# Patient Record
Sex: Female | Born: 1990 | Race: White | Hispanic: No | Marital: Married | Smoking: Never smoker
Health system: Southern US, Community
[De-identification: ages and names within clinical notes are randomized; demographics above are authoritative.]

## PROBLEM LIST (undated history)

## (undated) DIAGNOSIS — G479 Sleep disorder, unspecified: Secondary | ICD-10-CM

## (undated) DIAGNOSIS — T8859XA Other complications of anesthesia, initial encounter: Secondary | ICD-10-CM

## (undated) DIAGNOSIS — F329 Major depressive disorder, single episode, unspecified: Secondary | ICD-10-CM

## (undated) DIAGNOSIS — E0500 Thyrotoxicosis with diffuse goiter without thyrotoxic crisis or storm: Secondary | ICD-10-CM

## (undated) DIAGNOSIS — K648 Other hemorrhoids: Secondary | ICD-10-CM

## (undated) DIAGNOSIS — T7840XA Allergy, unspecified, initial encounter: Secondary | ICD-10-CM

## (undated) DIAGNOSIS — Z8619 Personal history of other infectious and parasitic diseases: Secondary | ICD-10-CM

## (undated) DIAGNOSIS — Z87898 Personal history of other specified conditions: Secondary | ICD-10-CM

## (undated) DIAGNOSIS — R350 Frequency of micturition: Secondary | ICD-10-CM

## (undated) DIAGNOSIS — T4145XA Adverse effect of unspecified anesthetic, initial encounter: Secondary | ICD-10-CM

## (undated) DIAGNOSIS — N809 Endometriosis, unspecified: Secondary | ICD-10-CM

## (undated) DIAGNOSIS — F3181 Bipolar II disorder: Secondary | ICD-10-CM

## (undated) DIAGNOSIS — Z915 Personal history of self-harm: Secondary | ICD-10-CM

## (undated) DIAGNOSIS — F419 Anxiety disorder, unspecified: Secondary | ICD-10-CM

## (undated) DIAGNOSIS — T7421XA Adult sexual abuse, confirmed, initial encounter: Secondary | ICD-10-CM

## (undated) DIAGNOSIS — K219 Gastro-esophageal reflux disease without esophagitis: Secondary | ICD-10-CM

## (undated) DIAGNOSIS — R3915 Urgency of urination: Secondary | ICD-10-CM

## (undated) DIAGNOSIS — R351 Nocturia: Secondary | ICD-10-CM

## (undated) DIAGNOSIS — Z5189 Encounter for other specified aftercare: Secondary | ICD-10-CM

## (undated) DIAGNOSIS — L739 Follicular disorder, unspecified: Secondary | ICD-10-CM

## (undated) DIAGNOSIS — M797 Fibromyalgia: Secondary | ICD-10-CM

## (undated) DIAGNOSIS — R1031 Right lower quadrant pain: Secondary | ICD-10-CM

## (undated) DIAGNOSIS — F32A Depression, unspecified: Secondary | ICD-10-CM

## (undated) DIAGNOSIS — E039 Hypothyroidism, unspecified: Secondary | ICD-10-CM

## (undated) DIAGNOSIS — K589 Irritable bowel syndrome without diarrhea: Secondary | ICD-10-CM

## (undated) HISTORY — DX: Other hemorrhoids: K64.8

## (undated) HISTORY — DX: Fibromyalgia: M79.7

## (undated) HISTORY — DX: Personal history of other infectious and parasitic diseases: Z86.19

## (undated) HISTORY — DX: Irritable bowel syndrome, unspecified: K58.9

## (undated) HISTORY — DX: Hypothyroidism, unspecified: E03.9

## (undated) HISTORY — DX: Encounter for other specified aftercare: Z51.89

## (undated) HISTORY — DX: Personal history of other specified conditions: Z87.898

## (undated) HISTORY — DX: Gastro-esophageal reflux disease without esophagitis: K21.9

## (undated) HISTORY — DX: Endometriosis, unspecified: N80.9

## (undated) HISTORY — DX: Thyrotoxicosis with diffuse goiter without thyrotoxic crisis or storm: E05.00

## (undated) HISTORY — DX: Allergy, unspecified, initial encounter: T78.40XA

## (undated) HISTORY — DX: Adult sexual abuse, confirmed, initial encounter: T74.21XA

## (undated) HISTORY — DX: Follicular disorder, unspecified: L73.9

## (undated) HISTORY — DX: Bipolar II disorder: F31.81

---
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
## 2008-06-08 ENCOUNTER — Encounter: Admission: RE | Admit: 2008-06-08 | Discharge: 2008-06-08 | Payer: Self-pay | Admitting: Gastroenterology

## 2008-08-02 ENCOUNTER — Other Ambulatory Visit: Admission: RE | Admit: 2008-08-02 | Discharge: 2008-08-02 | Payer: Self-pay | Admitting: Obstetrics and Gynecology

## 2010-08-13 ENCOUNTER — Ambulatory Visit (HOSPITAL_BASED_OUTPATIENT_CLINIC_OR_DEPARTMENT_OTHER): Admission: RE | Admit: 2010-08-13 | Discharge: 2010-08-14 | Payer: Self-pay | Admitting: Otolaryngology

## 2010-08-13 HISTORY — PX: TONSILLECTOMY AND ADENOIDECTOMY: SUR1326

## 2010-10-28 ENCOUNTER — Encounter (HOSPITAL_COMMUNITY): Payer: Self-pay | Admitting: Obstetrics and Gynecology

## 2010-11-29 ENCOUNTER — Other Ambulatory Visit: Payer: Self-pay | Admitting: Gastroenterology

## 2010-11-30 ENCOUNTER — Ambulatory Visit
Admission: RE | Admit: 2010-11-30 | Discharge: 2010-11-30 | Disposition: A | Discharge status: Home / Self Care | Payer: BC Managed Care – PPO | Source: Ambulatory Visit | Attending: Gastroenterology | Admitting: Gastroenterology

## 2010-11-30 MED ORDER — IOHEXOL 300 MG/ML  SOLN
100.0000 mL | Freq: Once | INTRAMUSCULAR | Status: AC | PRN
  Administered 2010-11-30: 100 mL via INTRAVENOUS
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
## 2011-07-19 LAB — TSH: TSH: 0.007 — ABNORMAL LOW

## 2011-07-19 LAB — SALICYLATE LEVEL: Salicylate Lvl: <4

## 2011-07-19 LAB — I-STAT 8, (EC8 V) (CONVERTED LAB)
HCT: 45
Potassium: 3.6
TCO2: 29
pCO2, Ven: 52.5 — ABNORMAL HIGH
pH, Ven: 7.318 — ABNORMAL HIGH

## 2011-07-19 LAB — CBC
HCT: 43.5
Hemoglobin: 14.7
MCV: 82.4
WBC: 19.2 — ABNORMAL HIGH

## 2011-07-19 LAB — URINE MICROSCOPIC-ADD ON

## 2011-07-19 LAB — T3, FREE: T3, Free: 4.7 — ABNORMAL HIGH (ref 2.3–4.2)

## 2011-07-19 LAB — DIFFERENTIAL
Eosinophils Relative: 0
Lymphocytes Relative: 8 — ABNORMAL LOW
Lymphs Abs: 1.5
Monocytes Absolute: 0.7
Monocytes Relative: 4

## 2011-07-19 LAB — COMPREHENSIVE METABOLIC PANEL
ALT: 17
AST: 22
Alkaline Phosphatase: 94
CO2: 23
Chloride: 108
Sodium: 139
Total Bilirubin: 2.2 — ABNORMAL HIGH

## 2011-07-19 LAB — BASIC METABOLIC PANEL
BUN: 3 — ABNORMAL LOW
BUN: 5 — ABNORMAL LOW
Chloride: 110
Chloride: 108
Creatinine, Ser: 0.53
Glucose, Bld: 100 — ABNORMAL HIGH
Glucose, Bld: 98
Potassium: 3.9
Sodium: 139

## 2011-07-19 LAB — ETHANOL: Alcohol, Ethyl (B): <5

## 2011-07-19 LAB — URINALYSIS, ROUTINE W REFLEX MICROSCOPIC
Glucose, UA: NEGATIVE
Hgb urine dipstick: NEGATIVE
Ketones, ur: NEGATIVE
Leukocytes, UA: NEGATIVE
Nitrite: NEGATIVE
Specific Gravity, Urine: 1.014
Specific Gravity, Urine: 1.015
Urobilinogen, UA: 0.2
pH: 6.5

## 2011-07-19 LAB — HEPATIC FUNCTION PANEL
ALT: 14
Bilirubin, Direct: 0.1
Indirect Bilirubin: 0.4
Total Protein: 6.4

## 2011-07-19 LAB — RAPID URINE DRUG SCREEN, HOSP PERFORMED
Amphetamines: NOT DETECTED
Opiates: NOT DETECTED
Tetrahydrocannabinol: NOT DETECTED

## 2011-07-19 LAB — THYROID ANTIBODIES
Thyroglobulin Ab: 169.8 U/mL — ABNORMAL HIGH
Thyroperoxidase Ab SerPl-aCnc: 2215.2 U/mL — ABNORMAL HIGH

## 2011-07-19 LAB — ACETAMINOPHEN LEVEL: Acetaminophen (Tylenol), Serum: <10 — ABNORMAL LOW

## 2011-07-19 LAB — POCT PREGNANCY, URINE: Operator id: 277751

## 2011-07-19 LAB — GAMMA GT: GGT: 17

## 2011-07-19 LAB — RPR: RPR Ser Ql: NONREACTIVE

## 2012-01-06 DIAGNOSIS — N809 Endometriosis, unspecified: Secondary | ICD-10-CM

## 2012-01-06 HISTORY — DX: Endometriosis, unspecified: N80.9

## 2012-01-13 ENCOUNTER — Other Ambulatory Visit: Payer: Self-pay | Admitting: Urology

## 2012-01-13 ENCOUNTER — Encounter (HOSPITAL_COMMUNITY): Payer: Self-pay | Admitting: Pharmacist

## 2012-01-16 ENCOUNTER — Encounter (HOSPITAL_BASED_OUTPATIENT_CLINIC_OR_DEPARTMENT_OTHER): Payer: Self-pay | Admitting: *Deleted

## 2012-01-17 ENCOUNTER — Encounter (HOSPITAL_BASED_OUTPATIENT_CLINIC_OR_DEPARTMENT_OTHER): Payer: Self-pay | Admitting: *Deleted

## 2012-01-17 NOTE — Progress Notes (Signed)
NPO AFTER MN. ARRIVES AT 0600. PRE-OP ORDERS FROM DR Hyacinth Meeker PENDING. OTHERWISE NEEDS HG AND URINE PREG.  WILL TAKE XANAX AM OF SURG. W/ SIP OF WATER. PT HAS HIGH ANXIETY W/ IV'S AND NEEDLES.

## 2012-01-18 NOTE — H&P (Signed)
History of Present Illness   I was consulted by Dr. Hyacinth Meeker regarding Jennifer Ho's pain. She gets right lower quadrant pain. It has been off and on for many months. It got worse in February. It is not daily. It comes and goes. Tylenol helps. She voids every hour to hour and 15 minutes. She has difficulty holding it for 2 hours. She gets up once or twice a night. She is continent. She has intermittent dyspareunia. She denies a history of kidney stones, previous GU surgery and urinary tract infections.  She has no neurologic risk factors or symptoms. She has not had a hysterectomy. She sometimes has diarrhea.  There is no other modifying factors or associated signs or symptoms. There is no other aggravating or relieving factors. The symptoms are mild to moderate in severity and persisting.  She is scheduled for a diagnostic procedure by myself and Dr. Hyacinth Meeker on Monday.    Past Medical History Problems  1. History of  Depression 311 2. History of  Fibromyalgia 729.1  Surgical History Problems  1. History of  Oral Surgery Tooth Extraction 2. History of  Repair Of Humerus / Arm 3. History of  Tonsillectomy  Current Meds 1. LaMICtal 150 MG Oral Tablet; Therapy: (Recorded:08Apr2013) to 2. Magnesium CAPS; Therapy: (Recorded:08Apr2013) to 3. Multiple Vitamin TABS; Therapy: (Recorded:08Apr2013) to 4. Probiotic CAPS; Therapy: (Recorded:08Apr2013) to 5. Vitamin D CAPS; Therapy: (Recorded:08Apr2013) to  Allergies Medication  1. No Known Drug Allergies  Family History Problems  1. Maternal history of  Endometriosis 2. Sororal history of  Rectal Polyps  Social History Problems    Marital History - Divorced V61.03   Never A Smoker   Occupation: Consulting civil engineer Denied    History of  Alcohol Use  Review of Systems Constitutional, skin, eye, otolaryngeal, hematologic/lymphatic, cardiovascular, pulmonary and endocrine system(s) were reviewed and pertinent findings if present are noted.    Genitourinary: urinary frequency.  Gastrointestinal: abdominal pain.  Musculoskeletal: back pain and joint pain.  Neurological: headache.  Psychiatric: depression and anxiety.    Vitals Vital Signs [Data Includes: Last 1 Day]  08Apr2013 11:24AM  BMI Calculated: 22.5 BSA Calculated: 1.72 Height: 5 ft 6 in Weight: 140 lb  Blood Pressure: 120 / 73 Temperature: 99.2 F Heart Rate: 108  Physical Exam Constitutional: Well nourished and well developed . No acute distress.  ENT:. The ears and nose are normal in appearance.  Neck: The appearance of the neck is normal and no neck mass is present.  Pulmonary: No respiratory distress and normal respiratory rhythm and effort.  Cardiovascular: Heart rate and rhythm are normal . No peripheral edema.  Abdomen: The abdomen is soft and nontender. No masses are palpated. No CVA tenderness. No hernias are palpable. No hepatosplenomegaly noted.  Lymphatics: The femoral and inguinal nodes are not enlarged or tender.  Skin: Normal skin turgor, no visible rash and no visible skin lesions.  Neuro/Psych:. Mood and affect are appropriate.   . Genitourinary: On pelvic examination Jennifer Ho had no prolapse.  Right levator  muscles were minimally tender.  Left levator and bladder were not tender.    Results/Data    Urinalysis: I reviewed, negative.   Review of Medical Records: I reviewed her medical records and Dr. Hyacinth Meeker is concerned that she may have interstitial cystitis. Urine [Data Includes: Last 1 Day]   08Apr2013  COLOR YELLOW   APPEARANCE CLEAR   SPECIFIC GRAVITY 1.015   pH 7.5   GLUCOSE NEG mg/dL  BILIRUBIN NEG   KETONE NEG  mg/dL  BLOOD NEG   PROTEIN NEG mg/dL  UROBILINOGEN 0.2 mg/dL  NITRITE NEG   LEUKOCYTE ESTERASE NEG    Assessment  Assessed  1. Chronic Cystitis 595.2        Plan   Discussion/Summary   Jennifer Ho has ongoing abdominal pain. She has been worked up by Gastroenterology. She saw Neurology and Rheumatology and  has questioned whether or not she has fibromyalgia. She has had a CT scan. She has had a transvaginal ultrasound several months ago. She is scheduled for a hydrodistension on Monday.  I talked to her about the potential of having interstitial cystitis. I talked to her about a hydrodistension.   We talked about cystoscopy/hydrodistension and instillation in detail. Pros, cons, general surgical and anesthetic risks, and other options including watchful waiting were discussed. Risks were described but not limited to pain, infection, and bleeding. The risk of bladder perforation and management were discussed. The patient understands that it is primarily a diagnostic procedure.   Dr. Hyacinth Meeker is concerned that Jennifer Ho may have ____. She consented to a hydrodistension and the procedure will be done with Dr. Hyacinth Meeker on Monday.  After a thorough review of the management options for the patient's condition the patient  elected to proceed with surgical therapy as noted above. We have discussed the potential benefits and risks of the procedure, side effects of the proposed treatment, the likelihood of the patient achieving the goals of the procedure, and any potential problems that might occur during the procedure or recuperation. Informed consent has been obtained.

## 2012-01-19 NOTE — H&P (Signed)
Jennifer Ho is an 21 y.o. female G0 SWF with history of pelvic and abdominal pain.  Patient has been evaluated with pelvic ultrasound 11/07/10 which was completely normal.  At that time, she and I had a lengthy discussion regarding the typical causes and evaluation of pelvic pain.  She is on the Nuva ring and uses it two months continually before she cycles.  Her pain is not classic for endometriosis and seems more GI related to me.  She is changing jobs and will be without insurance for several months.  Because of this, she has decided to try and get all the evaluation possible done during the next two months.  She has seen Dr. Sherron Monday who will do a cystoscopy on her at the same time as the laparoscopy.  Surgery has been described in detail.  Risks and benefits were all dicussed in my office.  Patient became nauseated as we discussed this.  She declined2 watching an in-office video on this procedure.    Pertinent Gynecological History: Menses: lasts about 5 days and occurs about every two months Bleeding: regular Contraception: NuvaRing vaginal inserts DES exposure: denies Blood transfusions: none Sexually transmitted diseases: no past history Previous GYN Procedures: colposcopy done 01/16/12  Last mammogram: N/A Date: N/A Last pap: abnormal: ASCUS with postivie HR HPV Date: 3/13 OB History: G0, P0   Menstrual History: Menarche age: 89 Patient's last menstrual period was 01/02/2012.    Past Medical History  Diagnosis Date  . History of suicide attempt 06-09-2007    overdose-  RX MEDS AND OTC  . RLQ abdominal pain   . Depression   . Grave's disease DX 2008-- FOLLOWED BY DR Talmage Nap AND PCP DR READE    TOOK MEDS UNTIL 2010--  IN REMISSION SINCE  . Complication of anesthesia     HIGH ANXIETY W/ IV AND NEEDLES  . Frequency of urination   . Urgency of urination   . Nocturia   . Sleep disturbance NIGHTMARES  . Anxiety     Past Surgical History  Procedure Date  . Tonsillectomy and  adenoidectomy 08-13-2010  . Orif left arm fx 1992  . Wisdom tooth extraction 2006    DENTAL OFFICE    History reviewed. No pertinent family history.  Social History:  reports that she has never smoked. She has never used smokeless tobacco. She reports that she does not drink alcohol or use illicit drugs.  Allergies: No Known Allergies  No prescriptions prior to admission    Review of Systems  Constitutional: Negative for fever.  Respiratory: Negative for cough.   Cardiovascular: Negative for chest pain.  Gastrointestinal: Negative for heartburn, nausea and vomiting.  Genitourinary: Negative for dysuria and urgency.  Skin: Negative for rash.  Neurological: Negative for dizziness and headaches.  Endo/Heme/Allergies: Does not bruise/bleed easily.  Psychiatric/Behavioral: The patient is nervous/anxious.     Height 5\' 6"  (1.676 m), weight 63.504 kg (140 lb), last menstrual period 01/02/2012. Physical Exam  Constitutional: She is oriented to person, place, and time. She appears well-developed and well-nourished.  HENT:  Head: Normocephalic and atraumatic.  Neck: Normal range of motion. Neck supple.  Cardiovascular: Normal rate and regular rhythm.   Respiratory: Effort normal and breath sounds normal.  GI: Soft. Bowel sounds are normal.  Musculoskeletal: Normal range of motion.  Neurological: She is alert and oriented to person, place, and time.  Skin: Skin is warm and dry.  Psychiatric: She has a normal mood and affect.    No results  found for this or any previous visit (from the past 24 hour(s)).  No results found.  Assessment/Plan: 21 year old SWF with pelvic and abdominal pain here for evaluation with diagnostic laparoscopy and cystoscopy.  Risks and benefits all discussed and documented in my office chart.  Crytal Pensinger,M SUZANNE 01/19/2012, 5:07 PM

## 2012-01-19 NOTE — Anesthesia Preprocedure Evaluation (Addendum)
Anesthesia Evaluation  Patient identified by MRN, date of birth, ID band Patient awake    Reviewed: Allergy & Precautions, H&P , NPO status , Patient's Chart, lab work & pertinent test results  Airway Mallampati: II TM Distance: >3 FB Neck ROM: full    Dental No notable dental hx. (+) Teeth Intact and Dental Advisory Given   Pulmonary neg pulmonary ROS,  breath sounds clear to auscultation  Pulmonary exam normal       Cardiovascular Exercise Tolerance: Good negative cardio ROS  Rhythm:regular Rate:Normal     Neuro/Psych PSYCHIATRIC DISORDERS Anxiety Depression Very high degree of anxiety/ panicnegative neurological ROS  negative psych ROS   GI/Hepatic negative GI ROS, Neg liver ROS, GERD-  ,  Endo/Other  negative endocrine ROSHyperthyroidism Graves disease in remission  Renal/GU negative Renal ROS  negative genitourinary   Musculoskeletal   Abdominal   Peds  Hematology negative hematology ROS (+)   Anesthesia Other Findings   Reproductive/Obstetrics negative OB ROS                         Anesthesia Physical Anesthesia Plan  ASA: II  Anesthesia Plan: General   Post-op Pain Management:    Induction: Intravenous  Airway Management Planned: Oral ETT  Additional Equipment:   Intra-op Plan:   Post-operative Plan: Extubation in OR  Informed Consent: I have reviewed the patients History and Physical, chart, labs and discussed the procedure including the risks, benefits and alternatives for the proposed anesthesia with the patient or authorized representative who has indicated his/her understanding and acceptance.   Dental Advisory Given  Plan Discussed with: CRNA and Surgeon  Anesthesia Plan Comments:        Anesthesia Quick Evaluation

## 2012-01-20 ENCOUNTER — Encounter (HOSPITAL_BASED_OUTPATIENT_CLINIC_OR_DEPARTMENT_OTHER): Payer: Self-pay | Admitting: Anesthesiology

## 2012-01-20 ENCOUNTER — Ambulatory Visit (HOSPITAL_BASED_OUTPATIENT_CLINIC_OR_DEPARTMENT_OTHER): Payer: BC Managed Care – PPO | Admitting: Anesthesiology

## 2012-01-20 ENCOUNTER — Encounter (HOSPITAL_BASED_OUTPATIENT_CLINIC_OR_DEPARTMENT_OTHER): Payer: Self-pay | Admitting: *Deleted

## 2012-01-20 ENCOUNTER — Ambulatory Visit (HOSPITAL_BASED_OUTPATIENT_CLINIC_OR_DEPARTMENT_OTHER)
Admission: RE | Admit: 2012-01-20 | Discharge: 2012-01-20 | Disposition: A | Discharge status: Home / Self Care | Payer: BC Managed Care – PPO | Source: Ambulatory Visit | Attending: Obstetrics & Gynecology | Admitting: Obstetrics & Gynecology

## 2012-01-20 ENCOUNTER — Encounter (HOSPITAL_BASED_OUTPATIENT_CLINIC_OR_DEPARTMENT_OTHER)
Admission: RE | Disposition: A | Discharge status: Home / Self Care | Payer: Self-pay | Source: Ambulatory Visit | Attending: Obstetrics & Gynecology

## 2012-01-20 DIAGNOSIS — IMO0001 Reserved for inherently not codable concepts without codable children: Secondary | ICD-10-CM | POA: Insufficient documentation

## 2012-01-20 DIAGNOSIS — Z79899 Other long term (current) drug therapy: Secondary | ICD-10-CM | POA: Insufficient documentation

## 2012-01-20 DIAGNOSIS — N803 Endometriosis of pelvic peritoneum, unspecified: Secondary | ICD-10-CM | POA: Insufficient documentation

## 2012-01-20 DIAGNOSIS — R102 Pelvic and perineal pain: Secondary | ICD-10-CM | POA: Diagnosis present

## 2012-01-20 DIAGNOSIS — N949 Unspecified condition associated with female genital organs and menstrual cycle: Secondary | ICD-10-CM | POA: Insufficient documentation

## 2012-01-20 HISTORY — DX: Nocturia: R35.1

## 2012-01-20 HISTORY — PX: LAPAROSCOPY: SHX197

## 2012-01-20 HISTORY — DX: Personal history of self-harm: Z91.5

## 2012-01-20 HISTORY — DX: Urgency of urination: R39.15

## 2012-01-20 HISTORY — DX: Anxiety disorder, unspecified: F41.9

## 2012-01-20 HISTORY — DX: Right lower quadrant pain: R10.31

## 2012-01-20 HISTORY — DX: Frequency of micturition: R35.0

## 2012-01-20 HISTORY — DX: Depression, unspecified: F32.A

## 2012-01-20 HISTORY — DX: Adverse effect of unspecified anesthetic, initial encounter: T41.45XA

## 2012-01-20 HISTORY — PX: CYSTO WITH HYDRODISTENSION: SHX5453

## 2012-01-20 HISTORY — DX: Major depressive disorder, single episode, unspecified: F32.9

## 2012-01-20 HISTORY — DX: Thyrotoxicosis with diffuse goiter without thyrotoxic crisis or storm: E05.00

## 2012-01-20 HISTORY — DX: Sleep disorder, unspecified: G47.9

## 2012-01-20 HISTORY — DX: Other complications of anesthesia, initial encounter: T88.59XA

## 2012-01-20 LAB — POCT HEMOGLOBIN-HEMACUE: Hemoglobin: 14 g/dL (ref 12.0–15.0)

## 2012-01-20 SURGERY — CYSTOSCOPY, WITH BLADDER HYDRODISTENSION
Anesthesia: General | Site: Bladder | Wound class: Clean Contaminated

## 2012-01-20 MED ORDER — GLYCOPYRROLATE 0.2 MG/ML IJ SOLN
INTRAMUSCULAR | Status: DC | PRN
  Administered 2012-01-20: 0.4 mg via INTRAVENOUS

## 2012-01-20 MED ORDER — MIDAZOLAM HCL 5 MG/5ML IJ SOLN
INTRAMUSCULAR | Status: DC | PRN
  Administered 2012-01-20 (×2): 2 mg via INTRAVENOUS

## 2012-01-20 MED ORDER — STERILE WATER FOR IRRIGATION IR SOLN
Status: DC | PRN
  Administered 2012-01-20: 3000 mL via INTRAVESICAL

## 2012-01-20 MED ORDER — FENTANYL CITRATE 0.05 MG/ML IJ SOLN
INTRAMUSCULAR | Status: DC | PRN
  Administered 2012-01-20: 50 ug via INTRAVENOUS
  Administered 2012-01-20: 25 ug via INTRAVENOUS
  Administered 2012-01-20: 50 ug via INTRAVENOUS
  Administered 2012-01-20: 25 ug via INTRAVENOUS
  Administered 2012-01-20: 50 ug via INTRAVENOUS

## 2012-01-20 MED ORDER — OXYCODONE-ACETAMINOPHEN 5-325 MG PO TABS
1.0000 | ORAL_TABLET | Freq: Once | ORAL | Status: AC
  Administered 2012-01-20: 1 via ORAL

## 2012-01-20 MED ORDER — PROPOFOL 10 MG/ML IV EMUL
INTRAVENOUS | Status: DC | PRN
  Administered 2012-01-20: 200 mg via INTRAVENOUS

## 2012-01-20 MED ORDER — PHENAZOPYRIDINE HCL 200 MG PO TABS
ORAL | Status: DC | PRN
  Administered 2012-01-20: 09:00:00 via INTRAVESICAL

## 2012-01-20 MED ORDER — BUPIVACAINE HCL 0.25 % IJ SOLN
INTRAMUSCULAR | Status: DC | PRN
  Administered 2012-01-20: 8 mL

## 2012-01-20 MED ORDER — ONDANSETRON HCL 4 MG/2ML IJ SOLN
INTRAMUSCULAR | Status: DC | PRN
  Administered 2012-01-20: 4 mg via INTRAVENOUS

## 2012-01-20 MED ORDER — LIDOCAINE HCL (CARDIAC) 20 MG/ML IV SOLN
INTRAVENOUS | Status: DC | PRN
  Administered 2012-01-20: 60 mg via INTRAVENOUS

## 2012-01-20 MED ORDER — DEXAMETHASONE SODIUM PHOSPHATE 4 MG/ML IJ SOLN
INTRAMUSCULAR | Status: DC | PRN
  Administered 2012-01-20: 8 mg via INTRAVENOUS

## 2012-01-20 MED ORDER — FENTANYL CITRATE 0.05 MG/ML IJ SOLN
25.0000 ug | INTRAMUSCULAR | Status: DC | PRN
  Administered 2012-01-20: 25 ug via INTRAVENOUS

## 2012-01-20 MED ORDER — LACTATED RINGERS IV SOLN: INTRAVENOUS | Status: DC

## 2012-01-20 MED ORDER — NEOSTIGMINE METHYLSULFATE 1 MG/ML IJ SOLN
INTRAMUSCULAR | Status: DC | PRN
  Administered 2012-01-20: 3 mg via INTRAVENOUS

## 2012-01-20 MED ORDER — KETOROLAC TROMETHAMINE 30 MG/ML IJ SOLN
INTRAMUSCULAR | Status: DC | PRN
  Administered 2012-01-20: 30 mg via INTRAVENOUS

## 2012-01-20 MED ORDER — LACTATED RINGERS IV SOLN
INTRAVENOUS | Status: DC
  Administered 2012-01-20 (×2): via INTRAVENOUS

## 2012-01-20 MED ORDER — ROCURONIUM BROMIDE 100 MG/10ML IV SOLN
INTRAVENOUS | Status: DC | PRN
  Administered 2012-01-20: 30 mg via INTRAVENOUS

## 2012-01-20 MED ORDER — CEFAZOLIN SODIUM 1-5 GM-% IV SOLN
1.0000 g | INTRAVENOUS | Status: AC
  Administered 2012-01-20: 1 g via INTRAVENOUS

## 2012-01-20 MED ORDER — CIPROFLOXACIN IN D5W 400 MG/200ML IV SOLN: 400.0000 mg | INTRAVENOUS | Status: DC

## 2012-01-20 SURGICAL SUPPLY — 53 items
5 X 100 TROCAR ×3 IMPLANT
APPLICATOR COTTON TIP 6IN STRL (MISCELLANEOUS) ×3 IMPLANT
BAG DRAIN URO-CYSTO SKYTR STRL (DRAIN) IMPLANT
BLADE SURG 11 STRL SS (BLADE) ×3 IMPLANT
CANISTER SUCT LVC 12 LTR MEDI- (MISCELLANEOUS) ×3 IMPLANT
CANISTER SUCTION 2500CC (MISCELLANEOUS) ×3 IMPLANT
CATH ROBINSON RED A/P 12FR (CATHETERS) ×3 IMPLANT
CATH ROBINSON RED A/P 14FR (CATHETERS) IMPLANT
CATH ROBINSON RED A/P 16FR (CATHETERS) ×3 IMPLANT
CHLORAPREP W/TINT 26ML (MISCELLANEOUS) ×3 IMPLANT
CLOTH BEACON ORANGE TIMEOUT ST (SAFETY) ×6 IMPLANT
DERMABOND ADVANCED (GAUZE/BANDAGES/DRESSINGS) ×1
DERMABOND ADVANCED .7 DNX12 (GAUZE/BANDAGES/DRESSINGS) ×2 IMPLANT
DRAPE CAMERA CLOSED 9X96 (DRAPES) ×6 IMPLANT
DRAPE UNDERBUTTOCKS STRL (DRAPE) ×3 IMPLANT
DRESSING TELFA 8X3 (GAUZE/BANDAGES/DRESSINGS) ×3 IMPLANT
ELECT REM PT RETURN 9FT ADLT (ELECTROSURGICAL) ×3
ELECTRODE REM PT RTRN 9FT ADLT (ELECTROSURGICAL) ×2 IMPLANT
GLOVE BIO SURGEON STRL SZ7.5 (GLOVE) ×3 IMPLANT
GLOVE ECLIPSE 6.5 STRL STRAW (GLOVE) ×6 IMPLANT
GLOVE INDICATOR 7.0 STRL GRN (GLOVE) ×6 IMPLANT
GOWN PREVENTION PLUS LG XLONG (DISPOSABLE) ×3 IMPLANT
GOWN STRL REIN XL XLG (GOWN DISPOSABLE) ×6 IMPLANT
NDL SAFETY ECLIPSE 18X1.5 (NEEDLE) ×2 IMPLANT
NEEDLE HYPO 18GX1.5 SHARP (NEEDLE) ×1
NEEDLE HYPO 25X1 1.5 SAFETY (NEEDLE) IMPLANT
NEEDLE INSUFFLATION 14GA 120MM (NEEDLE) ×3 IMPLANT
NEEDLE INSUFFLATION 14GA 150MM (NEEDLE) IMPLANT
NS IRRIG 500ML POUR BTL (IV SOLUTION) ×3 IMPLANT
PACK BASIN DAY SURGERY FS (CUSTOM PROCEDURE TRAY) ×3 IMPLANT
PACK CYSTOSCOPY (CUSTOM PROCEDURE TRAY) IMPLANT
PACK LAPAROSCOPY II (CUSTOM PROCEDURE TRAY) ×3 IMPLANT
PAD OB MATERNITY 4.3X12.25 (PERSONAL CARE ITEMS) ×3 IMPLANT
PAD PREP 24X48 CUFFED NSTRL (MISCELLANEOUS) ×3 IMPLANT
SCISSORS LAP 5X35 DISP (ENDOMECHANICALS) IMPLANT
SET IRRIG TUBING LAPAROSCOPIC (IRRIGATION / IRRIGATOR) ×3 IMPLANT
SOLUTION ANTI FOG 6CC (MISCELLANEOUS) ×3 IMPLANT
SOLUTION ELECTROLUBE (MISCELLANEOUS) IMPLANT
SUT SILK 0 TIES 10X30 (SUTURE) IMPLANT
SUT VIC AB 4-0 SH 27 (SUTURE)
SUT VIC AB 4-0 SH 27XANBCTRL (SUTURE) IMPLANT
SUT VICRYL 0 UR6 27IN ABS (SUTURE) IMPLANT
SUT VICRYL RAPIDE 4/0 PS 2 (SUTURE) ×3 IMPLANT
SYR 20CC LL (SYRINGE) ×3 IMPLANT
SYR 30ML LL (SYRINGE) ×3 IMPLANT
SYR CONTROL 10ML LL (SYRINGE) IMPLANT
SYRINGE 10CC LL (SYRINGE) ×6 IMPLANT
TOWEL OR 17X24 6PK STRL BLUE (TOWEL DISPOSABLE) ×6 IMPLANT
TRAY DSU PREP LF (CUSTOM PROCEDURE TRAY) ×3 IMPLANT
TROCAR XCEL NON-BLD 11X100MML (ENDOMECHANICALS) ×3 IMPLANT
TUBING INSUFFLATION W/FILTER (TUBING) ×3 IMPLANT
WATER STERILE IRR 3000ML UROMA (IV SOLUTION) ×3 IMPLANT
WATER STERILE IRR 500ML POUR (IV SOLUTION) ×3 IMPLANT

## 2012-01-20 NOTE — Op Note (Signed)
01/20/2012  8:54 AM  PATIENT:  Jennifer Ho  21 y.o. female with several year history of pelvic pain, abdominal pain with gas and bloating.  Pelvic ultrasound done 11/07/10 was normal.  She has seen Dr. Loreta Ave, GI, and an evaluation for inflammatory bowel disease  was performed as well as a CT.  These were negative.  Patient has seen Dr. Sherron Monday and we decided to proceed with a combined procedure.  PRE-OPERATIVE DIAGNOSIS:  Pelvic pain  POST-OPERATIVE DIAGNOSIS:  Pelvic pain  PROCEDURE:  Procedure(s): CYSTOSCOPY/HYDRODISTENSION LAPAROSCOPY DIAGNOSTIC  SURGEON:  Liba Hulsey,M SUZANNE  ASSISTANTS: OR staff   ANESTHESIA:   general, Dr. Leta Jungling oversaw the case  ESTIMATED BLOOD LOSS: None  BLOOD ADMINISTERED:none   FLUIDS: 1300cc LR  UOP: 100 cc drained with I&O cath at beginning of procedure  SPECIMEN:  Peritoneal biopsy  DISPOSITION OF SPECIMEN:  PATHOLOGY  FINDINGS: Dr Sherron Monday saw interstitial cystitis on cystoscopy.  On laparoscopy, normal upper abdomen with normal liver edge and stomach edge.  Gallbladder was not seen.  Normal appendix.  Normal ovaries and fallopian tubes.  Normal uterus and peritoneum except for three small areas of either vascular change or endometriosis.  All were biopsied and sent to pathology together.  DESCRIPTION OF OPERATION: Patient was taken to the operating room. She was placed in the supine position. General endotracheal anesthesia was managed by the anesthesia staff without difficulty. The left arm was tucked and the right arm was left out on armboard. SCDs were on her lower extremity bilaterally and functioning properly. The legs were placed in [place] stirrups and in the low lithotomy position. The legs were then lifted to the high lithotomy position. A timeout was performed. Dr. Sherron Monday was available and in the building.  Her fever ring was removed from the vagina. Chlor prep was used to prep the abdomen and Betadine was used to prep the inner thighs  vagina and perineum. The patient was draped in a normal standard fashion.  At this point Dr. Jacquelyne Balint performed his portion of the procedure. Please see his separate dictation. Evidence of interstitial cystitis was noted on the cystoscopy. The bladder was drained of all urine at the end of this procedure.   A bivalve speculum was placed the vagina. The anterior lip of the cervix was grasped with single-tooth tenaculum. A Hulka tenaculum was passed through the endocervical canal attached the anterior lip of the cervix as a means of manipulating the uterus during the procedure. The single-tooth tenaculum and the bivalve speculum were removed. The legs were lowered to the low lithotomy position. Attention was turned to the abdomen. Quarter percent Marcaine was used to anesthetize the skin the need the umbilicus. A 10 mm incision was made with a 11 blade. The subcutaneous fat and tissue was dissected with hemostat. A Veress needle was obtained with stopcock open. The abdomen was elevated and the needle was passed through the abdominal wall layers without difficulty. This peritoneum was passed through a pop was felt. The syringe of normal saline was obtained and this was attached to the needle. An aspiration was performed. No blood or fluid was noted. The fluid was injected into the needle. Finally second aspiration was performed no blood fluid or saline was noted. Saline dripped easily into the needle. The syringe was removed and CO2 gas a low flows was attached. A pneumoperitoneum was achieved under low flow of CO2 gas. Once about 3 L of gas was in the abdomen the Veress needle was removed. A 10  mm laparoscope attached to a non-bladed Optiview laparoscopic trocar and port. This was passed through direct visualization into the abdomen. The trocar was removed. The laparoscope was used to survey the upper and lower abdomen. The patient was placed in Trendelenburg.  There appeared to be 2-3 small areas of possible  endometriosis so the decision was made to place and left lower quadrant port.  The skin was transilluminated and after the abdominal wall vasculature was noted, skin was anesthetized with quarter percent Marcaine. A 5 mm skin incision was made with a #11 blade. 5 mm non-bladed trocar port were placed the left lower quadrant under direct visualization with the laparoscope. A blunt probe was used to help lift both ovaries off sidewalls. The upper abdomen appeared normal with normal liver edge and normal stomach edge. No gallbladder was seen. The appendix is normal. The tubes and ovaries appeared normal bilaterally. The uterus appeared normal. The tissue beneath the bladder appeared normal. There is a small purplish lesion deep in the posterior cul-de-sac and 2 small areas on the right sidewall up of the ureter.  A biopsy forcep was obtained. The 3 lesions were biopsied and sent together to pathology. There is no bleeding from the biopsy sites. The pelvis was irrigated with normal saline. No bleeding was noted. Irrigant was removed from the pelvis and at this point the procedure was ended.  The left lower quadrant port was removed under direct visualization of the laparoscope. The laparoscope was then removed and the pneumoperitoneum was relieved. The patient was taken out of Trendelenburg positioning. Before the midline port was removed, the CRNA give the patient several good deep breaths to help give any gas the abdomen. The midline port was removed. The fascia was closed at the midline with a figure-of-eight suture of #0 Vicryl. The 2 skin incisions were then closed with a subcuticular stitch of 3-0 Vicryl. The incisions were cleaned and Dermabond was applied.  Attention was turned back to the vagina. A Hulka clamp was removed from the cervix. No bleeding was noted from the tenaculum sites. The neighboring was placed back in the vagina. A [place] Foley catheter is in place and the bladder and about 50 cc of  urine was drained. Then a mixture of premium and Marcaine as ordered by Dr. Jacquelyne Balint was instilled in the bladder. A Foley catheter was removed and the scintillation was left in the bladder to the case. 30 mg IV and 30 mg IM of Toradol was given. The legs were positioned back in the supine position after the Betadine prep was cleansed of her skin. Sponge, lap, needle, and instrument counts were correct x2. The patient was awakened from anesthesia, extubated, and taken to the recovery.   COUNTS:  YES  PLAN OF CARE: Transfer to PACU

## 2012-01-20 NOTE — Progress Notes (Signed)
Dr Sherron Monday in to see pt prior to pt D/C & states he doesn't need to be notified after pt voids.

## 2012-01-20 NOTE — Op Note (Signed)
Preoperative diagnosis: Pelvic pain Postoperative diagnosis: Interstitial cystitis Surgery: Bladder hydrodistention and cystoscopy Surgeon: Dr. Lorin Picket Gregori Abril  Ms. Jennifer Ho is pelvic pain and was having a laparoscopy by Dr. Hyacinth Meeker and consented to the above procedure. She has urinary frequency. Preoperative antibiotics were given. Lead position was excellent  21 French cystoscope was utilized. Bladder mucosa and trigone were normal. There was no stitch or foreign body or carcinoma. She was hydrodistended to 500 mL. I emptied her bladder and reinspected it. She had diffuse glomerulations that were bleeding minimally with no ulcers or bladder injury.  Bladder was emptied. Dr. Hyacinth Meeker lead insulation after her laparoscopy

## 2012-01-20 NOTE — Anesthesia Procedure Notes (Signed)
Procedure Name: Intubation Date/Time: 01/20/2012 7:46 AM Performed by: Renella Cunas D Pre-anesthesia Checklist: Patient identified, Emergency Drugs available, Suction available and Patient being monitored Patient Re-evaluated:Patient Re-evaluated prior to inductionOxygen Delivery Method: Circle System Utilized Preoxygenation: Pre-oxygenation with 100% oxygen Intubation Type: IV induction Ventilation: Mask ventilation without difficulty Laryngoscope Size: Mac and 3 Grade View: Grade I Tube type: Oral Tube size: 7.0 mm Number of attempts: 1 Airway Equipment and Method: stylet,  oral airway,  Stylet and LTA kit utilized Placement Confirmation: ETT inserted through vocal cords under direct vision,  positive ETCO2 and breath sounds checked- equal and bilateral Secured at: 21 cm Tube secured with: Tape Dental Injury: Teeth and Oropharynx as per pre-operative assessment

## 2012-01-20 NOTE — Discharge Instructions (Signed)
Post-surgical Instructions, Outpatient Surgery  You may expect to feel dizzy, weak, and drowsy for as long as 24 hours after receiving the medicine that made you sleep (anesthetic). For the first 24 hours after your surgery:    Do not drive a car, ride a bicycle, participate in physical activities, or take public transportation until you are done taking narcotic pain medicines or as directed by Dr. Hyacinth Meeker.   Do not drink alcohol or take tranquilizers.   Do not take medicine that has not been prescribed by your physicians.   Do not sign important papers or make important decisions while on narcotic pain medicines.   Have a responsible person with you.   CARE OF INCISION  If you have a bandage, you may remove it in one day.  Dermabond has been placed on your incisions.  It will take about 10 days for this to really loosen.  Then you can pull it off of your incisions.     You may shower on the first day after your surgery.  Do not sit in a tub bath for one week.  Avoid heavy lifting (more than 10 pounds/4.5 kilograms), pushing, or pulling.   Avoid activities that may risk injury to your incisions.   There will be some orange tint to your skin.  This is from the skin prep used at the beginning of the procedure.  It will come off over the next two days as you shower.  PAIN MANAGEMENT  Motrin 800mg .  (This is the same as 4-200mg  over the counter tablets of Motrin or ibuprofen.)  You may take this every eight hours or as needed for cramping.    Vicodin 5/500mg .  For more severe pain, take one or two tablets every four to six hours as needed for pain control.  (Remember that narcotic pain medications increase your risk of constipation.  If this becomes a problem, you may take an over the counter stool softener like Colace 100mg  up to four times a day.)  You should alternate these two medications.  Take the Motrin three times a day even if you aren't hurting during the next three to four days.   Use the narcotic medication every 4-6 hours as you need.  Using the Motrin will help you use less narcotic.  DO'S AND DON'T'S  Do not take a tub bath for one week.  You may shower on the first day after your surgery  Do not do any heavy lifting for one to two weeks.  This increases the chance of bleeding.  Do move around as you feel able.  Stairs are fine.  You may begin to exercise again as you feel able.  Do not lift any weights for two weeks.  Do not put anything in the vagina for two weeks--no tampons, intercourse, or douching.  It IS okay to continue using your NUVA ring.  REGULAR MEDIATIONS/VITAMINS:  You may restart all of your regular medications as prescribed.  You may restart all of your vitamins as you normally take them.  It is a good idea to wait starting the vitamins until you are having regular bowel movements.  PLEASE CALL OR SEEK MEDICAL CARE IF:  You have persistent nausea and vomiting.   You have an oral temperature above 100.5.   You have constipation that is not helped by adjusting diet or increasing fluid intake. Pain medicines are a common cause of constipation.  You have redness or drainage from your incision(s) or there is  increasing pain or tenderness near or in the surgical site.

## 2012-01-20 NOTE — Transfer of Care (Signed)
Immediate Anesthesia Transfer of Care Note  Patient: Jennifer Ho  Procedure(s) Performed: Procedure(s) (LRB): CYSTOSCOPY/HYDRODISTENSION (N/A) LAPAROSCOPY DIAGNOSTIC (N/A)  Patient Location: PACU  Anesthesia Type: General  Level of Consciousness: awake, oriented, sedated and patient cooperative  Airway & Oxygen Therapy: Patient Spontanous Breathing and Patient connected to face mask oxygen  Post-op Assessment: Report given to PACU RN and Post -op Vital signs reviewed and stable  Post vital signs: Reviewed and stable  Complications: No apparent anesthesia complications

## 2012-01-20 NOTE — Anesthesia Postprocedure Evaluation (Signed)
  Anesthesia Post-op Note  Patient: Jennifer Ho  Procedure(s) Performed: Procedure(s) (LRB): CYSTOSCOPY/HYDRODISTENSION (N/A) LAPAROSCOPY DIAGNOSTIC (N/A)  Patient Location: PACU  Anesthesia Type: General  Level of Consciousness: awake and alert   Airway and Oxygen Therapy: Patient Spontanous Breathing  Post-op Pain: mild  Post-op Assessment: Post-op Vital signs reviewed, Patient's Cardiovascular Status Stable, Respiratory Function Stable, Patent Airway and No signs of Nausea or vomiting  Post-op Vital Signs: stable  Complications: No apparent anesthesia complications

## 2012-01-21 ENCOUNTER — Encounter (HOSPITAL_BASED_OUTPATIENT_CLINIC_OR_DEPARTMENT_OTHER): Payer: Self-pay | Admitting: Urology

## 2012-01-21 SURGERY — LAPAROSCOPY OPERATIVE
Anesthesia: General

## 2012-01-24 ENCOUNTER — Encounter (HOSPITAL_BASED_OUTPATIENT_CLINIC_OR_DEPARTMENT_OTHER): Payer: Self-pay

## 2012-04-02 ENCOUNTER — Ambulatory Visit (HOSPITAL_COMMUNITY)
Admission: RE | Admit: 2012-04-02 | Discharge: 2012-04-02 | Disposition: A | Discharge status: Home / Self Care | Payer: BC Managed Care – PPO | Source: Ambulatory Visit | Attending: Rheumatology | Admitting: Rheumatology

## 2012-04-02 DIAGNOSIS — M7989 Other specified soft tissue disorders: Secondary | ICD-10-CM

## 2012-04-02 DIAGNOSIS — M79609 Pain in unspecified limb: Secondary | ICD-10-CM

## 2012-04-02 DIAGNOSIS — M79606 Pain in leg, unspecified: Secondary | ICD-10-CM

## 2012-04-02 NOTE — Progress Notes (Signed)
VASCULAR LAB PRELIMINARY  PRELIMINARY  PRELIMINARY  PRELIMINARY  Right lower extremity venous duplex completed.    Preliminary report:  Right lower extremity is negative for deep and superficial vein thrombosis.  Payden Bonus,  RVT 04/02/2012, 5:59 PM

## 2012-08-12 LAB — HM PAP SMEAR: HM Pap smear: NEGATIVE

## 2012-10-07 DIAGNOSIS — E039 Hypothyroidism, unspecified: Secondary | ICD-10-CM

## 2012-10-07 HISTORY — DX: Hypothyroidism, unspecified: E03.9

## 2013-02-03 ENCOUNTER — Encounter: Payer: Self-pay | Admitting: *Deleted

## 2013-02-04 ENCOUNTER — Encounter: Payer: Self-pay | Admitting: Obstetrics & Gynecology

## 2013-02-04 ENCOUNTER — Ambulatory Visit (INDEPENDENT_AMBULATORY_CARE_PROVIDER_SITE_OTHER): Payer: BC Managed Care – PPO | Admitting: Obstetrics & Gynecology

## 2013-02-04 ENCOUNTER — Telehealth: Payer: Self-pay | Admitting: Obstetrics & Gynecology

## 2013-02-04 VITALS — BP 118/68 | HR 72 | Resp 16 | Ht 66.0 in | Wt 153.0 lb

## 2013-02-04 DIAGNOSIS — Z124 Encounter for screening for malignant neoplasm of cervix: Secondary | ICD-10-CM

## 2013-02-04 MED ORDER — ETONOGESTREL-ETHINYL ESTRADIOL 0.12-0.015 MG/24HR VA RING: VAGINAL_RING | VAGINAL | Status: DC

## 2013-02-04 MED ORDER — ESTROGENS, CONJUGATED 0.625 MG/GM VA CREA: TOPICAL_CREAM | Freq: Every day | VAGINAL | Status: DC

## 2013-02-04 NOTE — Patient Instructions (Signed)

## 2013-02-04 NOTE — Progress Notes (Signed)
Verified Rx for Nuvaring #4, 4 refills (uses continuously) with [place] [facility].
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
## 2013-02-04 NOTE — Telephone Encounter (Signed)
PT CALLING TO GIVE KELLY THE DATE OF HER LAST DTAP WHICH WAS 01/13/12.

## 2013-02-05 LAB — IPS PAP TEST WITH REFLEX TO HPV

## 2013-04-06 ENCOUNTER — Telehealth: Payer: Self-pay | Admitting: Obstetrics & Gynecology

## 2013-04-06 NOTE — Telephone Encounter (Signed)
Patient having trouble with getting refills on Nuvaring for for months at time. Please assist with [facility] [id number].

## 2013-04-06 NOTE — Telephone Encounter (Signed)
S/w pt she uses the nuvaring continuously, so she would need to have 4 rings at a time instead of three. [facility] only gives her three due to the sig. Rx for #4/4rf's was sent 02/04/13. [facility] and made sure that the rx was for 4 rings with refills for a year pt is aware.

## 2014-02-10 ENCOUNTER — Encounter: Payer: Self-pay | Admitting: Obstetrics & Gynecology

## 2014-02-10 ENCOUNTER — Ambulatory Visit (INDEPENDENT_AMBULATORY_CARE_PROVIDER_SITE_OTHER): Payer: 59 | Admitting: Obstetrics & Gynecology

## 2014-02-10 VITALS — BP 110/70 | HR 68 | Ht 66.0 in | Wt 166.6 lb

## 2014-02-10 DIAGNOSIS — Z8659 Personal history of other mental and behavioral disorders: Secondary | ICD-10-CM

## 2014-02-10 DIAGNOSIS — M797 Fibromyalgia: Secondary | ICD-10-CM

## 2014-02-10 DIAGNOSIS — Z124 Encounter for screening for malignant neoplasm of cervix: Secondary | ICD-10-CM

## 2014-02-10 DIAGNOSIS — F3289 Other specified depressive episodes: Secondary | ICD-10-CM

## 2014-02-10 DIAGNOSIS — N809 Endometriosis, unspecified: Secondary | ICD-10-CM | POA: Insufficient documentation

## 2014-02-10 DIAGNOSIS — Z9151 Personal history of suicidal behavior: Secondary | ICD-10-CM

## 2014-02-10 DIAGNOSIS — Z915 Personal history of self-harm: Secondary | ICD-10-CM | POA: Insufficient documentation

## 2014-02-10 DIAGNOSIS — F32A Depression, unspecified: Secondary | ICD-10-CM

## 2014-02-10 DIAGNOSIS — E039 Hypothyroidism, unspecified: Secondary | ICD-10-CM

## 2014-02-10 DIAGNOSIS — F329 Major depressive disorder, single episode, unspecified: Secondary | ICD-10-CM

## 2014-02-10 DIAGNOSIS — Z01419 Encounter for gynecological examination (general) (routine) without abnormal findings: Secondary | ICD-10-CM

## 2014-02-10 DIAGNOSIS — IMO0002 Reserved for concepts with insufficient information to code with codable children: Secondary | ICD-10-CM | POA: Insufficient documentation

## 2014-02-10 DIAGNOSIS — IMO0001 Reserved for inherently not codable concepts without codable children: Secondary | ICD-10-CM

## 2014-02-10 MED ORDER — ETONOGESTREL-ETHINYL ESTRADIOL 0.12-0.015 MG/24HR VA RING: VAGINAL_RING | VAGINAL | Status: DC
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
## 2014-02-14 LAB — IPS PAP TEST WITH HPV

## 2014-03-04 ENCOUNTER — Ambulatory Visit: Payer: BC Managed Care – PPO | Admitting: Obstetrics & Gynecology

## 2014-04-21 ENCOUNTER — Ambulatory Visit: Payer: BC Managed Care – PPO | Admitting: Obstetrics & Gynecology

## 2014-06-02 ENCOUNTER — Encounter: Payer: Self-pay | Admitting: Nurse Practitioner

## 2014-06-02 ENCOUNTER — Ambulatory Visit (INDEPENDENT_AMBULATORY_CARE_PROVIDER_SITE_OTHER): Payer: 59 | Admitting: Nurse Practitioner

## 2014-06-02 VITALS — BP 94/62 | HR 68 | Resp 16 | Ht 66.0 in | Wt 164.0 lb

## 2014-06-02 DIAGNOSIS — B3731 Acute candidiasis of vulva and vagina: Secondary | ICD-10-CM

## 2014-06-02 DIAGNOSIS — N3001 Acute cystitis with hematuria: Secondary | ICD-10-CM

## 2014-06-02 DIAGNOSIS — N3000 Acute cystitis without hematuria: Secondary | ICD-10-CM

## 2014-06-02 DIAGNOSIS — B373 Candidiasis of vulva and vagina: Secondary | ICD-10-CM

## 2014-06-02 MED ORDER — FLUCONAZOLE 150 MG PO TABS: 150.0000 mg | ORAL_TABLET | Freq: Once | ORAL | Status: DC

## 2014-06-02 NOTE — Patient Instructions (Signed)

## 2014-06-02 NOTE — Progress Notes (Signed)
Subjective:     Patient ID: Jennifer Ho, female   DOB: Jun 07, 1991, 23 y.o.   MRN: [id number]  HPI  This 23 yo G0P0 MW Fe presents with vaginal symptoms since last Friday.   She was seen at Urgent Care for  UTI about 2 weeks ago.  She was treated initially with one antibiotic, then she called back and med's were changed secondary to persistent left abdominal pain.   She has complete Cipro about a week ago.  Is not scheduled to return for TOC there.  Symptoms of vaginal itching and burning is current concern.   LMP started 06/02/14. She is not prone to yeast vaginitis.  Flow today is heavier.  She is on Paediatric nurse for contraception.  Review of Systems  Constitutional: Negative for fever, chills and fatigue.  Gastrointestinal: Negative.   Genitourinary: Positive for vaginal bleeding and vaginal discharge. Negative for dysuria, urgency, frequency, hematuria, flank pain, menstrual problem, pelvic pain and dyspareunia.  Musculoskeletal: Negative.   Skin: Negative.   Neurological: Negative.   Psychiatric/Behavioral: Negative.        Objective:   Physical Exam  Constitutional: She is oriented to person, place, and time. She appears well-developed and well-nourished.  Abdominal: Soft. She exhibits no distension. There is no tenderness. There is no rebound and no guarding.  No flank pain  Genitourinary:  Moderate amount of vaginal bleeding and unable to do a wet prep. No pain at the bladder.  Urine culture sent to the lab.  Neurological: She is alert and oriented to person, place, and time.  Psychiatric: She has a normal mood and affect. Her behavior is normal. Judgment and thought content normal.       Assessment:     Yeast vaginitis with recent antibiotic coverage TOC for UTI - treated at Urgent Care about 2 weeks ago    Plan:     Diflucan 150 mg X 2 doses. Will follow with urine culture If symptoms not improved to CB

## 2014-06-03 ENCOUNTER — Encounter: Payer: Self-pay | Admitting: Obstetrics & Gynecology

## 2014-06-03 LAB — URINE CULTURE
Colony Count: NO GROWTH
Organism ID, Bacteria: NO GROWTH

## 2014-06-06 NOTE — Progress Notes (Signed)
Encounter reviewed by Dr. Tonnia Bardin Silva.  
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
## 2014-09-13 ENCOUNTER — Ambulatory Visit (INDEPENDENT_AMBULATORY_CARE_PROVIDER_SITE_OTHER): Payer: 59 | Admitting: Cardiovascular Disease

## 2014-09-13 ENCOUNTER — Encounter: Payer: Self-pay | Admitting: Cardiovascular Disease

## 2014-09-13 VITALS — BP 112/76 | HR 60 | Ht 66.0 in | Wt 165.4 lb

## 2014-09-13 DIAGNOSIS — N3090 Cystitis, unspecified without hematuria: Secondary | ICD-10-CM

## 2014-09-13 DIAGNOSIS — R55 Syncope and collapse: Secondary | ICD-10-CM | POA: Insufficient documentation

## 2014-09-13 NOTE — Assessment & Plan Note (Signed)
Continue replacement TSH normal f/u primary

## 2014-09-13 NOTE — Patient Instructions (Signed)
Your physician recommends that you continue on your current medications as directed. Please refer to the Current Medication list given to you today.  Your physician has requested that you have an echocardiogram. Echocardiography is a painless test that uses sound waves to create images of your heart. It provides your doctor with information about the size and shape of your heart and how well your heart's chambers and valves are working. This procedure takes approximately one hour. There are no restrictions for this procedure.   Your physician recommends that you schedule a follow-up appointment as needed  

## 2014-09-13 NOTE — Addendum Note (Signed)
Addended by: Kyla BalzarinePRICE, Hetvi Shawhan A on: 09/13/2014 10:44 AM   Modules accepted: Orders

## 2014-09-13 NOTE — Assessment & Plan Note (Signed)
F/U urology no signs of infection may be vagal trigger

## 2014-09-13 NOTE — Assessment & Plan Note (Signed)
Vasovagal Trigger not apparent Non cardiac  Given palpitations and dyspnea will order echo but change of structural heart problem quite low with normal ECG and exam

## 2014-09-14 ENCOUNTER — Ambulatory Visit (HOSPITAL_COMMUNITY): Payer: 59 | Attending: Cardiovascular Disease | Admitting: Radiology

## 2014-09-14 DIAGNOSIS — R55 Syncope and collapse: Secondary | ICD-10-CM | POA: Diagnosis present

## 2014-09-14 NOTE — Progress Notes (Signed)
Echocardiogram performed.  

## 2014-09-15 ENCOUNTER — Telehealth: Payer: Self-pay | Admitting: Cardiovascular Disease

## 2014-09-15 DIAGNOSIS — R002 Palpitations: Secondary | ICD-10-CM

## 2014-09-15 NOTE — Telephone Encounter (Signed)
Can order 30 day event monitor

## 2014-09-15 NOTE — Telephone Encounter (Signed)
New Message  Pt called requests to wear a monitor and a call back to discuss. Please call

## 2014-09-15 NOTE — Telephone Encounter (Signed)
PT  WANTING   TO  WEAR   MONITOR  WILL FORWARD  TO DR Eden EmmsNISHAN FOR  REVIEW .Zack Seal/CY

## 2014-09-20 NOTE — Telephone Encounter (Signed)
PT  AWARE  ORDER ENTERED FOR  MONITOR

## 2014-09-20 NOTE — Telephone Encounter (Signed)
Event monitor scheduled for 09/22/2014, pt notified

## 2014-09-22 ENCOUNTER — Encounter (INDEPENDENT_AMBULATORY_CARE_PROVIDER_SITE_OTHER): Payer: 59

## 2014-09-22 ENCOUNTER — Encounter: Payer: Self-pay | Admitting: *Deleted

## 2014-09-22 DIAGNOSIS — R002 Palpitations: Secondary | ICD-10-CM

## 2014-09-22 NOTE — Progress Notes (Signed)
Patient ID: Jennifer Ho, female   DOB: 03/29/1991, 23 y.o.   MRN: [id number] [employer] 30 day cardiac event monitor applied to patient.

## 2014-09-26 ENCOUNTER — Ambulatory Visit (INDEPENDENT_AMBULATORY_CARE_PROVIDER_SITE_OTHER): Payer: 59 | Admitting: Neurology

## 2014-09-26 ENCOUNTER — Encounter: Payer: Self-pay | Admitting: Neurology

## 2014-09-26 VITALS — BP 104/63 | HR 70 | Ht 66.0 in | Wt 168.0 lb

## 2014-09-26 DIAGNOSIS — F329 Major depressive disorder, single episode, unspecified: Secondary | ICD-10-CM

## 2014-09-26 DIAGNOSIS — E038 Other specified hypothyroidism: Secondary | ICD-10-CM

## 2014-09-26 DIAGNOSIS — G609 Hereditary and idiopathic neuropathy, unspecified: Secondary | ICD-10-CM

## 2014-09-26 DIAGNOSIS — F32A Depression, unspecified: Secondary | ICD-10-CM

## 2014-09-26 DIAGNOSIS — R404 Transient alteration of awareness: Secondary | ICD-10-CM

## 2014-09-26 DIAGNOSIS — R55 Syncope and collapse: Secondary | ICD-10-CM
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
## 2014-09-27 DIAGNOSIS — G609 Hereditary and idiopathic neuropathy, unspecified: Secondary | ICD-10-CM | POA: Insufficient documentation

## 2014-09-27 DIAGNOSIS — R4182 Altered mental status, unspecified: Secondary | ICD-10-CM | POA: Insufficient documentation

## 2014-09-28 ENCOUNTER — Ambulatory Visit (INDEPENDENT_AMBULATORY_CARE_PROVIDER_SITE_OTHER): Payer: 59 | Admitting: Diagnostic Neuroimaging

## 2014-09-28 DIAGNOSIS — E038 Other specified hypothyroidism: Secondary | ICD-10-CM

## 2014-09-28 DIAGNOSIS — F32A Depression, unspecified: Secondary | ICD-10-CM

## 2014-09-28 DIAGNOSIS — F329 Major depressive disorder, single episode, unspecified: Secondary | ICD-10-CM

## 2014-09-28 DIAGNOSIS — R55 Syncope and collapse: Secondary | ICD-10-CM

## 2014-10-05 ENCOUNTER — Ambulatory Visit (INDEPENDENT_AMBULATORY_CARE_PROVIDER_SITE_OTHER): Payer: 59

## 2014-10-05 ENCOUNTER — Other Ambulatory Visit: Payer: Self-pay | Admitting: Neurology

## 2014-10-05 ENCOUNTER — Telehealth: Payer: Self-pay | Admitting: *Deleted

## 2014-10-05 DIAGNOSIS — F32A Depression, unspecified: Secondary | ICD-10-CM

## 2014-10-05 DIAGNOSIS — F329 Major depressive disorder, single episode, unspecified: Secondary | ICD-10-CM

## 2014-10-05 DIAGNOSIS — E038 Other specified hypothyroidism: Secondary | ICD-10-CM

## 2014-10-05 DIAGNOSIS — R55 Syncope and collapse: Secondary | ICD-10-CM

## 2014-10-05 NOTE — Telephone Encounter (Signed)
I have called her for normal EEG also released to my chart

## 2014-10-05 NOTE — Procedures (Signed)
   HISTORY: 23 years old female, with recurrent episode of sudden loss of consciousness  TECHNIQUE:  16 channel EEG was performed based on standard 10-16 international system. One channel was dedicated to EKG, which has demonstrates normal sinus rhythm of 78 beats per minutes.  Upon awakening, the posterior background activity was well-developed, in alpha range, 10 Hz, with amplitude of 40 microvoltage, reactive to eye opening and closure.  There was no evidence of epileptiform discharge.  Photic stimulation was performed, which induced a symmetric photic driving.  Hyperventilation was performed, there was no abnormality elicit.  Stage II sleep was achieved.  CONCLUSION: This is a  normal awake and asleep EEG.  There is no electrodiagnostic evidence of epileptiform discharge

## 2014-10-07 DIAGNOSIS — F3181 Bipolar II disorder: Secondary | ICD-10-CM

## 2014-10-07 HISTORY — DX: Bipolar II disorder: F31.81

## 2014-10-28 ENCOUNTER — Ambulatory Visit: Payer: 59 | Admitting: Neurology

## 2014-10-31 ENCOUNTER — Telehealth: Payer: Self-pay | Admitting: Obstetrics & Gynecology

## 2014-10-31 NOTE — Telephone Encounter (Signed)
Left message to call Leyani Gargus at [phone]. 

## 2014-10-31 NOTE — Telephone Encounter (Signed)
Spoke with patient. Appointment scheduled for tomorrow at 10:30am with Dr. Hyacinth MeekerMiller (time per Kennon RoundsSally). Patient agreeable to date and time.  Routing to provider for final review. Patient agreeable to disposition. Will close encounter

## 2014-10-31 NOTE — Telephone Encounter (Signed)
Spoke with patient. Patient states that two years ago she had surgery with Dr.Miller for endometriosis. States that over the last week with her cycle she has had increased discomfort. "I was in a lot of pain." LMP started 1/19 and ended today. Patient states for 2 months she has had increased upper leg pain that is off and on, mild pain with intercourse, and discomfort with bowel movements. "I had some of these symptoms before with my endometriosis so I want to make sure that it is not from that again." Patient requesting appointment this week with Dr.Miller for evaluation. Advised will need to speak with provider and return call. Patient is agreeable and would like voicemail left at number provided if she is not available to answer.

## 2014-10-31 NOTE — Telephone Encounter (Signed)
Patient is having some discomfort and would like to talk with Dr.Miller's nurse. Last seen 06/02/14.

## 2014-11-01 ENCOUNTER — Ambulatory Visit (INDEPENDENT_AMBULATORY_CARE_PROVIDER_SITE_OTHER): Payer: 59 | Admitting: Obstetrics & Gynecology

## 2014-11-01 ENCOUNTER — Encounter: Payer: Self-pay | Admitting: Obstetrics & Gynecology

## 2014-11-01 VITALS — BP 106/58 | HR 64 | Resp 16 | Wt 168.0 lb

## 2014-11-01 DIAGNOSIS — N809 Endometriosis, unspecified: Secondary | ICD-10-CM

## 2014-11-01 DIAGNOSIS — N946 Dysmenorrhea, unspecified: Secondary | ICD-10-CM

## 2014-11-01 DIAGNOSIS — N3010 Interstitial cystitis (chronic) without hematuria: Secondary | ICD-10-CM
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
## 2014-11-04 ENCOUNTER — Telehealth: Payer: Self-pay | Admitting: Obstetrics & Gynecology

## 2014-11-04 DIAGNOSIS — IMO0002 Reserved for concepts with insufficient information to code with codable children: Secondary | ICD-10-CM

## 2014-11-04 NOTE — Telephone Encounter (Signed)
Patient is calling to follow up on some appointments that are going to be made for her.

## 2014-11-04 NOTE — Telephone Encounter (Signed)
There were no referrals entered for this patient.

## 2014-11-04 NOTE — Telephone Encounter (Signed)
Routing to Dr.Miller for review and advise as per current OV there is no indication of referral needed. Note is not yet completed from 11/01/2014.

## 2014-11-07 ENCOUNTER — Telehealth: Payer: Self-pay | Admitting: *Deleted

## 2014-11-07 NOTE — Telephone Encounter (Signed)
PT  NOTIFIED ./CY 

## 2014-11-07 NOTE — Telephone Encounter (Signed)
LM  TO  CALL BACK RE MONITOR  RESULTS PER   DR NISHAN  SR SINUS  TACHYCARDIA NO  ARRHYTHMIA ./CY

## 2014-11-08 NOTE — Telephone Encounter (Signed)
Patient calling to check on status of scheduling appointments.

## 2014-11-08 NOTE — Telephone Encounter (Signed)
Routing to Dr Miller for review.  

## 2014-11-09 ENCOUNTER — Encounter: Payer: Self-pay | Admitting: Obstetrics & Gynecology

## 2014-11-09 NOTE — Telephone Encounter (Signed)
Spoke with Dr.MacDiarmid's office. Appointment scheduled for patient for 2/8 at 10:15am. Spoke with patient who is agreeable to date and time. Office visit note from 11/01/2014 faxed to Dr.MacDiarmid's office at [phone] with cover sheet and fax confirmation.  Cc: Sabrina franklin for precert of PUS scheduled for tomorrow  Routing to provider for final review. Patient agreeable to disposition. Will close encounter

## 2014-11-09 NOTE — Telephone Encounter (Signed)
Pt needs PUS here in office.  I think there is a possible opening for tomorrow.  She also needs visit with Dr. Sherron MondayMacDiarmid (already established pt) for possible bladder instillation due to increased pelvic pain/dyspareunia.  Pt has known IC and Dr. Sherron MondayMacDiarmid did do a cystoscopy on her either in 2012 or 2013.  Also, pt considering Depo Lupron due to increased pain.  Does have biopsy proven endometriosis so I will copy Kennon RoundsSally on note so this process can be started so she can be aware of cost.

## 2014-11-09 NOTE — Telephone Encounter (Signed)
Spoke with patient. Advised patient of message as seen below from Dr.Miller. Patient is agreeable. PUS scheduled for 2/4 at 1pm with 1:45pm consult with Dr.Miller. Patient is agreeable to date and time. Order placed but will need precert. Patient would like me to call Dr.MacDiarmid's office to set up appointment for first available date and time. Patient states that she has researched Lupron and has decided that she does not want to proceed with this at this time. Advised would let Dr.Miller know and if she changes her mind at any time to let our office know.  Cc: Jennifer RuddySally Yeakley as Lorain ChildesFYI

## 2014-11-10 ENCOUNTER — Ambulatory Visit (INDEPENDENT_AMBULATORY_CARE_PROVIDER_SITE_OTHER): Payer: 59

## 2014-11-10 ENCOUNTER — Ambulatory Visit (INDEPENDENT_AMBULATORY_CARE_PROVIDER_SITE_OTHER): Payer: 59 | Admitting: Obstetrics & Gynecology

## 2014-11-10 ENCOUNTER — Telehealth: Payer: Self-pay | Admitting: Emergency Medicine

## 2014-11-10 VITALS — BP 120/78 | Ht 66.0 in | Wt 168.0 lb

## 2014-11-10 DIAGNOSIS — N809 Endometriosis, unspecified: Secondary | ICD-10-CM

## 2014-11-10 DIAGNOSIS — IMO0002 Reserved for concepts with insufficient information to code with codable children: Secondary | ICD-10-CM

## 2014-11-10 DIAGNOSIS — M797 Fibromyalgia: Secondary | ICD-10-CM

## 2014-11-10 DIAGNOSIS — R1031 Right lower quadrant pain: Secondary | ICD-10-CM

## 2014-11-10 DIAGNOSIS — N3010 Interstitial cystitis (chronic) without hematuria: Secondary | ICD-10-CM

## 2014-11-10 DIAGNOSIS — N941 Dyspareunia: Secondary | ICD-10-CM

## 2014-11-10 NOTE — Telephone Encounter (Signed)
Patient in lab with additional questions for Dr. Hyacinth MeekerMiller.   Patient states she has reconsidered her options and would like to have Lupron pre-certed to see what costs will be.  She states she has reconsidered about starting oral contraception as well.   Patient has appointment in Urology on 11/11/14 to see if helps with pain. If not, will consider lupron.  Advised patient can have Lupron pre-certed for insurance coverage and our office would contact her back with further instructions.

## 2014-11-10 NOTE — Progress Notes (Signed)
24 y.o. G0 Married female here for a pelvic ultrasound.  Pt with known hx of endometriosis and IC.  Pt had laparoscopy 4/13 with biopsy proven endometriosis.  Has appointment with Dr. Sherron MondayMacDiarmid next week for bladder instillations to see if recent increase in pain is due to IC, which was diagnosed at laparoscopy in 01/2012 but never treated.    Going to [facility].  She is considering accupuncture for thyroid disorder.  She is wondering if this will help with pelvic pain.  D/w pt my personal results with accupuncture.  I am not sure it will help but it won't hurt and it is a good thing to try as I would prefer not to perform a repeat laparoscopy so soon after her last one.    Patient's last menstrual period was 10/27/2014.  Sexually active:  yes  Contraception: nuva ring  FINDINGS: UTERUS:  6.9 x 4.3 x 2.8cm EMS: 7.289mm, trilaminar ADNEXA:   Left ovary 2.3 x 2.0 x 2.2cm   Right ovary 2.8 x 2.0 x 1.6cm.  Ovaries appear normal. CUL DE SAC: no free fluid  Images reviewed with pt.  Plan reviewed with pt.  She is going to see urology next week and begin bladder instillations.  I have already called and spoke with Dr. Sherron MondayMacDiarmid about her so he knows to go ahead and start the instillations days of visit.  Pt still considering Depo-Lupron.  Also, d/w pt changing to OCPs to try and see if she can take longer to skip cycles for longer periods of time.  Pt states she is not "good with pills" but I think this is worth a try to try and stay out of the operating room.  States she will consider.  Assessment:  Pelvic pain, h/o biopsy proven endometriosis, h/o IC diagnosed by Dr. Sherron MondayMacDiarmid 2013 with cystoscopy  Plan: Seeing Dr. Sherron MondaymacDiarmid next week.  Start bladder instillations. If wants to change to OCPs, would change to 30mcg OCP like loestrin 1.5/30.  Pt to call and let me know if she wants to try this. Considering accupunture Doing food sensitivity testing.  Will draw blood today if possible.     ~25 minutes spent with patient >50% of time was in face to face discussion of above.  Pt takes considerable time with each visit due to her many questions and personal research she has done.

## 2014-11-14 NOTE — Telephone Encounter (Signed)
Precert signed on Friday.  Encounter closed.

## 2014-11-15 ENCOUNTER — Telehealth: Payer: Self-pay

## 2014-11-15 NOTE — Telephone Encounter (Signed)
Spoke with patient. Advised patient of message as seen below from Dr.Miller. Patient is agreeable and verbalizes understanding. Patient would like update on precert for Lupron injection. Advised will [facility] who has been working on this with patient to check on status. Advised will receive update as soon as possible on process. Patient is agreeable.  Cc: Almedia Ballsracy Fast, RN

## 2014-11-15 NOTE — Telephone Encounter (Signed)
She is having the correct treatment.  In the OR, we also use pyridium because there is often more immediate post operative pain.  He is doing what I wanted.  Please thank her for the update and let he know I got a note from him as well.  Then, OK to close encounter.

## 2014-11-15 NOTE — Telephone Encounter (Signed)
Spoke with patient at time of incoming call. Patient states that she went to appointment with Dr.MacDiarmid and has begun "lidocaine bladder treatments. I have a catheter inserted and lidocaine is inserted into my bladder. I have to hold it for sixty minutes before peeing it out." Patient states that she had a similar treatment back in 2013 when she had surgery with Dr.Miller. "I remember it being a different solution. This solution is clear and the other was a pink color. I have asked their office what the difference is and they do not know what I am talking about. I know Dr.Miller will know because she was there. I want to make sure that I am having the correct treatment. I do not know which one is better." Advised patient will need to speak with Dr.Miller and return call with further recommendations. Patient is agreeable.

## 2014-11-17 ENCOUNTER — Encounter: Payer: Self-pay | Admitting: Obstetrics & Gynecology

## 2014-11-17 DIAGNOSIS — Z0289 Encounter for other administrative examinations: Secondary | ICD-10-CM

## 2014-11-17 DIAGNOSIS — N3010 Interstitial cystitis (chronic) without hematuria: Secondary | ICD-10-CM | POA: Insufficient documentation

## 2014-11-25 ENCOUNTER — Telehealth: Payer: Self-pay | Admitting: *Deleted

## 2014-11-25 NOTE — Telephone Encounter (Signed)
Patient notified of status of Lupron order. Advised to expect call for [facility] with estimate of cost. If she is agreeable to cost, then she will need to arrange payment with [facility] and approve shipment. She will call back if questions.  Routing to provider for final review. Patient agreeable to disposition. Will close encounter

## 2014-11-25 NOTE — Telephone Encounter (Signed)
Call to DoranAbbvie, prior [employer] company for Lupron regarding benefit information received. Needs predetermination, clinic notes faxed to [employer] and then patient will be contacted directly.Will update patient.

## 2014-12-12 ENCOUNTER — Telehealth: Payer: Self-pay

## 2014-12-12 NOTE — Telephone Encounter (Signed)
Incoming phone call from RidgewayPeter with [facility] regarding PA for Lupron rx for patient. Theron Aristaeter states they are in need of further clinical information. Will fax over form with list of required documentation to complete PA.

## 2014-12-14 NOTE — Telephone Encounter (Signed)
All clinical information was faxed on 11/25/2014 by Billie RuddySally Yeakley, RN. Return fax states they are waiting to speak with the patient about authorization to ship. Advised patient will receive phone call to verify authorization. Please see telephone call dated 2/19.  Will close encounter.

## 2015-01-09 ENCOUNTER — Telehealth: Payer: Self-pay | Admitting: Obstetrics & Gynecology

## 2015-01-09 NOTE — Telephone Encounter (Signed)
Yes.  loestrin 1.5/30 taking continuous active pills.  Probably should schedule 3-4 month follow up with me as well.  Thanks.

## 2015-01-09 NOTE — Telephone Encounter (Addendum)
Message left to return call to Queen Anneracy at [phone].   Need to know if patient is still considering Lupron.   Dr. Hyacinth MeekerMiller, okay to place order for Loestrin 21 1.5/30 to take continously?

## 2015-01-09 NOTE — Telephone Encounter (Signed)
Patient calling requesting to "switch birth control to the other one Dr. Hyacinth MeekerMiller recommends to help with endometriosis."   [facility] [address]

## 2015-01-10 MED ORDER — NORETHINDRONE ACET-ETHINYL EST 1.5-30 MG-MCG PO TABS: 1.0000 | ORAL_TABLET | Freq: Every day | ORAL | Status: DC

## 2015-01-10 NOTE — Telephone Encounter (Signed)
Message left to return call to Jennifer Ho at [phone].    

## 2015-01-10 NOTE — Telephone Encounter (Signed)
Spoke with patient. She is given instructions on how to start oral contraception. Will take continuous active pills.  Patient has annual exam in May scheduled with Dr. Hyacinth MeekerMiller.  Patient has decided not to pursue option for Lupron as it will cost approximately $1200.00 per monthly injection. She will call back with any concerns or will follow up for annual exam as scheduled. Routing to provider for final review. Patient agreeable to disposition. Will close encounter

## 2015-01-10 NOTE — Telephone Encounter (Signed)
Returning a call to Tracy °

## 2015-02-14 ENCOUNTER — Ambulatory Visit (INDEPENDENT_AMBULATORY_CARE_PROVIDER_SITE_OTHER): Payer: 59 | Admitting: Neurology

## 2015-02-14 ENCOUNTER — Encounter: Payer: Self-pay | Admitting: Neurology

## 2015-02-14 VITALS — BP 99/70 | HR 70 | Ht 66.0 in | Wt 170.0 lb

## 2015-02-14 DIAGNOSIS — R404 Transient alteration of awareness: Secondary | ICD-10-CM | POA: Diagnosis not present

## 2015-02-14 DIAGNOSIS — G609 Hereditary and idiopathic neuropathy, unspecified: Secondary | ICD-10-CM

## 2015-02-14 DIAGNOSIS — R55 Syncope and collapse: Secondary | ICD-10-CM
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
## 2015-02-16 ENCOUNTER — Encounter: Payer: Self-pay | Admitting: Obstetrics & Gynecology

## 2015-02-16 ENCOUNTER — Ambulatory Visit (INDEPENDENT_AMBULATORY_CARE_PROVIDER_SITE_OTHER): Payer: 59 | Admitting: Obstetrics & Gynecology

## 2015-02-16 VITALS — BP 104/72 | HR 68 | Resp 16 | Ht 66.0 in | Wt 169.0 lb

## 2015-02-16 DIAGNOSIS — Z01419 Encounter for gynecological examination (general) (routine) without abnormal findings: Secondary | ICD-10-CM

## 2015-02-16 DIAGNOSIS — Z124 Encounter for screening for malignant neoplasm of cervix: Secondary | ICD-10-CM | POA: Diagnosis not present

## 2015-02-16 MED ORDER — NORETHINDRONE ACET-ETHINYL EST 1.5-30 MG-MCG PO TABS: 1.0000 | ORAL_TABLET | Freq: Every day | ORAL | Status: DC

## 2015-02-16 NOTE — Progress Notes (Signed)
Scheduled patient with [facility] for first available May 20th at 10am. Patient is agreeable to date and time.
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
## 2015-02-17 ENCOUNTER — Telehealth: Payer: Self-pay

## 2015-02-17 LAB — IPS PAP TEST WITH REFLEX TO HPV

## 2015-02-17 NOTE — Telephone Encounter (Signed)
Left message to call Kaitlyn at [phone]. 

## 2015-02-17 NOTE — Telephone Encounter (Signed)
Spoke with patient. Advised of message as seen below from Dr.Miller. Patient is agreeable and verbalizes understanding. Patient was seen today at urgent care and was placed on antibiotics. "They think it is a strep rash." Advised will let Dr.Miller know and to call if she needs anything. Patient is agreeable.  Routing to provider for final review. Patient agreeable to disposition. Patient aware provider will review message and nurse will return call with any additional instructions or change of disposition. Will close encounter.

## 2015-02-17 NOTE — Telephone Encounter (Signed)
Returning a call to Kaitlyn. °

## 2015-02-17 NOTE — Telephone Encounter (Signed)
-----   Message from Jerene BearsMary S Miller, MD sent at 02/16/2015  3:20 PM EDT ----- Please advise her if there are more lesions tomorrow am to see her PCP before the derm appt.  Thanks.  MSM ----- Message -----    From: Jannet AskewKaitlyn E Hines, RN    Sent: 02/16/2015   2:28 PM      To: Jerene BearsMary S Miller, MD  Dr.Miller,  I scheduled patient for first available appointment with [facility] on 5/20 at 10am. Dr. Dorita SciaraLupton's first available with him as well as a PA in their practice was 6/3. Campbell StallHope Gruber, MD's office first available was 5/25. Okay for patient to wait until 5/20 or should she see PCP before then?  Thank you, Yvonna AlanisKaitlyn

## 2015-02-28 ENCOUNTER — Telehealth: Payer: Self-pay | Admitting: Nurse Practitioner

## 2015-02-28 ENCOUNTER — Encounter: Payer: Self-pay | Admitting: Nurse Practitioner

## 2015-02-28 ENCOUNTER — Ambulatory Visit (INDEPENDENT_AMBULATORY_CARE_PROVIDER_SITE_OTHER): Payer: 59 | Admitting: Nurse Practitioner

## 2015-02-28 VITALS — BP 112/80 | HR 78 | Temp 99.1°F | Ht 66.0 in | Wt 168.8 lb

## 2015-02-28 DIAGNOSIS — Z0000 Encounter for general adult medical examination without abnormal findings: Secondary | ICD-10-CM

## 2015-02-28 DIAGNOSIS — IMO0002 Reserved for concepts with insufficient information to code with codable children: Secondary | ICD-10-CM

## 2015-02-28 LAB — POCT URINALYSIS DIPSTICK
LEUKOCYTES UA: NEGATIVE
PH UA: 5
Urobilinogen, UA: NEGATIVE

## 2015-02-28 MED ORDER — NYSTATIN 100000 UNIT/GM EX CREA: 1.0000 "application " | TOPICAL_CREAM | Freq: Two times a day (BID) | CUTANEOUS | Status: DC

## 2015-02-28 MED ORDER — TRIAMCINOLONE ACETONIDE 0.1 % EX CREA: 1.0000 "application " | TOPICAL_CREAM | Freq: Two times a day (BID) | CUTANEOUS | Status: DC

## 2015-02-28 MED ORDER — NYSTATIN-TRIAMCINOLONE 100000-0.1 UNIT/GM-% EX OINT: 1.0000 "application " | TOPICAL_OINTMENT | Freq: Two times a day (BID) | CUTANEOUS | Status: DC

## 2015-02-28 MED ORDER — FLUCONAZOLE 150 MG PO TABS: 150.0000 mg | ORAL_TABLET | Freq: Once | ORAL | Status: DC

## 2015-02-28 NOTE — Patient Instructions (Signed)

## 2015-02-28 NOTE — Progress Notes (Signed)
24 y.o. Married white Fe  G0 here with complaint of vaginal symptoms of itching, burning, and increase discharge. Describes discharge as white and thick.  She took recent antibiotics to treat strep and afterwards developed the yeast infection. Onset of symptoms 4 days ago. Denies new personal products or vaginal dryness. No STD concerns. Urinary symptoms none except she does have a history of IC in the past. . Contraception is   O:Healthy female WDWN Affect: normal, orientation x 3  Exam: alert, no distress Abdomen: soft and non tender Lymph node: no enlargement or tenderness Pelvic exam: External genital: normal female BUS: linear cuts and irritation Vagina: white thick consistent with yeast  Adnexa:normal, non tender, no masses or fullness noted, no bladder tenderness    A: Yeast vaginitis   P: Discussed findings of yeast and etiology. Discussed Aveeno or baking soda sitz bath for comfort. Avoid moist clothes or pads for extended period of time. If working out in gym clothes or swim suits for long periods of time change underwear or bottoms of swimsuit if possible. Olive Oil/Coconut Oil use for skin protection prior to activity can be used to external skin.  Rx:  Diflucan 150 mg X 2  Wipes that do not contain ETOH  Nystatin and triamcinolone prn BID to external vulva  RV prn

## 2015-02-28 NOTE — Telephone Encounter (Signed)
[facility]. Insurance does not cover mycolog cream.    Triamcinolone Cream (Kenalog) 0.1% Dispense 30 gram Apply topically bid. Nystatin Cream (Mycostatin) Dispense 30 gram Apply topically bid. Mix with small amount of Nystatin cream/Kenalog cream and apply as directed.    Called patient and instructions given. Patient verbalized understanding and will follow up prn or if symptoms do not improve. Patient agreeable.  Routing to provider for final review. Patient agreeable to disposition. Will close encounter.

## 2015-02-28 NOTE — Telephone Encounter (Signed)
Pharmacy calling stating patients prescription cannot be filled as written due to insurance reasons and asks prescription to be split in order to fill  [facility] [place] [phone]

## 2015-03-05 NOTE — Progress Notes (Signed)
Encounter reviewed by Dr. Brook Silva.  

## 2015-03-20 ENCOUNTER — Encounter (HOSPITAL_COMMUNITY): Payer: Self-pay | Admitting: *Deleted

## 2015-03-20 ENCOUNTER — Emergency Department (HOSPITAL_COMMUNITY)
Admission: EM | Admit: 2015-03-20 | Discharge: 2015-03-20 | Disposition: A | Discharge status: Home / Self Care | Payer: 59 | Source: Home / Self Care | Attending: Family Medicine | Admitting: Family Medicine

## 2015-03-20 DIAGNOSIS — M7072 Other bursitis of hip, left hip: Secondary | ICD-10-CM

## 2015-03-20 DIAGNOSIS — Z9189 Other specified personal risk factors, not elsewhere classified: Secondary | ICD-10-CM

## 2015-03-20 MED ORDER — FLUCONAZOLE 150 MG PO TABS: 150.0000 mg | ORAL_TABLET | Freq: Once | ORAL | Status: DC

## 2015-03-20 MED ORDER — MELOXICAM 7.5 MG PO TABS: 7.5000 mg | ORAL_TABLET | Freq: Two times a day (BID) | ORAL | Status: DC

## 2015-03-20 MED ORDER — DOXYCYCLINE HYCLATE 100 MG PO CAPS: 100.0000 mg | ORAL_CAPSULE | Freq: Two times a day (BID) | ORAL | Status: DC

## 2015-03-20 NOTE — ED Notes (Signed)
Pt  Reports    Symptoms   Of  Body   Aches         Pain l  Hip        Low garde temp        Noticed  A  Tick  On  His  Body      10  Days  Ago
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
## 2015-03-20 NOTE — Discharge Instructions (Signed)
Use medicine as prescribed, and ice pack on hip after pill for hip pain , see your orthopedist if further problems.

## 2015-06-27 ENCOUNTER — Ambulatory Visit (INDEPENDENT_AMBULATORY_CARE_PROVIDER_SITE_OTHER): Payer: 59 | Admitting: Obstetrics and Gynecology

## 2015-06-27 ENCOUNTER — Encounter: Payer: Self-pay | Admitting: Obstetrics and Gynecology

## 2015-06-27 ENCOUNTER — Telehealth: Payer: Self-pay | Admitting: Obstetrics & Gynecology

## 2015-06-27 ENCOUNTER — Other Ambulatory Visit: Payer: Self-pay | Admitting: Obstetrics and Gynecology

## 2015-06-27 ENCOUNTER — Other Ambulatory Visit (INDEPENDENT_AMBULATORY_CARE_PROVIDER_SITE_OTHER): Payer: 59

## 2015-06-27 VITALS — BP 110/60 | HR 68 | Temp 98.8°F | Resp 14 | Wt 168.0 lb

## 2015-06-27 DIAGNOSIS — R102 Pelvic and perineal pain: Secondary | ICD-10-CM | POA: Diagnosis not present

## 2015-06-27 DIAGNOSIS — N946 Dysmenorrhea, unspecified: Secondary | ICD-10-CM

## 2015-06-27 DIAGNOSIS — N939 Abnormal uterine and vaginal bleeding, unspecified: Secondary | ICD-10-CM

## 2015-06-27 DIAGNOSIS — N809 Endometriosis, unspecified: Secondary | ICD-10-CM

## 2015-06-27 DIAGNOSIS — R1030 Lower abdominal pain, unspecified: Secondary | ICD-10-CM | POA: Diagnosis not present

## 2015-06-27 LAB — POCT URINE PREGNANCY: Preg Test, Ur: NEGATIVE

## 2015-06-27 NOTE — Telephone Encounter (Signed)
Patient is asking for an ultrasound appointment. Last seen 02/28/15.

## 2015-06-27 NOTE — Telephone Encounter (Signed)
Spoke with patient. Patient states that she stopped taking OCP on June 15th. LMP was June 18th. Has been experiencing off and on spotting for two months. When spotting occurs patient has mild cramping. Started having cramping last night. Is having light spotting today. Took UPT which was negative. Patient is requesting to come in to be seen for an ultrasound. "I just feel like something is not right." Has a history of endometriosis. Advised I will speak with Dr.Miller and return call with further recommendations regarding scheduling.

## 2015-06-27 NOTE — Telephone Encounter (Signed)
Spoke with patient. Advised of message as seen below from Dr.Miller. Patient states that since we spoke her cramping has increased. "I was walking my dog and I was almost in tears." Patient is requesting to be seen today for further evaluation. "I just want to get everything checked and see what I need to do." Patient is agreeable to see another provider as Dr.Miller does not have any current openings for this afternoon. Appointment is scheduled for today at 2 pm with Dr.Jertson. Agreeable to date and time.  Cc: Dr.Jerston  Routing to provider for final review. Patient agreeable to disposition. Will close encounter.
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
## 2015-06-27 NOTE — Telephone Encounter (Signed)
OK to schedule for Thursday if appt is available.

## 2015-06-28 ENCOUNTER — Telehealth: Payer: Self-pay | Admitting: *Deleted

## 2015-06-28 LAB — GC/CHLAMYDIA PROBE AMP, URINE
Chlamydia, Swab/Urine, PCR: NEGATIVE
GC Probe Amp, Urine: NEGATIVE

## 2015-06-28 NOTE — Telephone Encounter (Signed)
-----   Message from Romualdo Bolk, MD sent at 06/27/2015  4:58 PM EDT ----- Please call to check on the patient on 9/21. If she is any worse she should come back in

## 2015-06-28 NOTE — Telephone Encounter (Signed)
06-28-15 LMTC. Checking to see how patient is feeling today. If patients is worse she needs to come back in today -eh

## 2015-06-28 NOTE — Telephone Encounter (Signed)
Returning call.

## 2015-06-28 NOTE — Telephone Encounter (Signed)
Spoke with patient. Patient states that her cramping is a lot better today. "I think the heating pad really helped. It is almost gone. I notice it mainly when I am up doing stuff on my right side but it has gotten better." Denies any new or worsening symptoms. Advised if she notices any changes will need to give our office a call to be seen for further evaluation. Patient is agreeable.  Cc: Nigel Sloop, CMA  Routing to provider for final review. Patient agreeable to disposition. Will close encounter.

## 2015-07-01 LAB — PROLACTIN: PROLACTIN: 19.7 ng/mL (ref 4.8–23.3)

## 2015-07-02 ENCOUNTER — Emergency Department (HOSPITAL_COMMUNITY)
Admission: EM | Admit: 2015-07-02 | Discharge: 2015-07-02 | Disposition: A | Discharge status: Home / Self Care | Payer: 59 | Attending: Emergency Medicine | Admitting: Emergency Medicine

## 2015-07-02 ENCOUNTER — Encounter (HOSPITAL_COMMUNITY): Payer: Self-pay | Admitting: Emergency Medicine

## 2015-07-02 ENCOUNTER — Emergency Department (HOSPITAL_COMMUNITY): Payer: 59

## 2015-07-02 DIAGNOSIS — F419 Anxiety disorder, unspecified: Secondary | ICD-10-CM | POA: Insufficient documentation

## 2015-07-02 DIAGNOSIS — F319 Bipolar disorder, unspecified: Secondary | ICD-10-CM | POA: Insufficient documentation

## 2015-07-02 DIAGNOSIS — E039 Hypothyroidism, unspecified: Secondary | ICD-10-CM | POA: Diagnosis not present

## 2015-07-02 DIAGNOSIS — M797 Fibromyalgia: Secondary | ICD-10-CM | POA: Diagnosis not present

## 2015-07-02 DIAGNOSIS — Z3202 Encounter for pregnancy test, result negative: Secondary | ICD-10-CM | POA: Insufficient documentation

## 2015-07-02 DIAGNOSIS — Z79899 Other long term (current) drug therapy: Secondary | ICD-10-CM | POA: Diagnosis not present

## 2015-07-02 DIAGNOSIS — K219 Gastro-esophageal reflux disease without esophagitis: Secondary | ICD-10-CM | POA: Insufficient documentation

## 2015-07-02 DIAGNOSIS — K5900 Constipation, unspecified: Secondary | ICD-10-CM | POA: Diagnosis not present

## 2015-07-02 DIAGNOSIS — R109 Unspecified abdominal pain: Secondary | ICD-10-CM

## 2015-07-02 LAB — URINALYSIS, ROUTINE W REFLEX MICROSCOPIC
BILIRUBIN URINE: NEGATIVE
Glucose, UA: NEGATIVE mg/dL
HGB URINE DIPSTICK: NEGATIVE
Ketones, ur: NEGATIVE mg/dL
Leukocytes, UA: NEGATIVE
NITRITE: NEGATIVE
PROTEIN: NEGATIVE mg/dL
Specific Gravity, Urine: 1.02 (ref 1.005–1.030)
UROBILINOGEN UA: 0.2 mg/dL (ref 0.0–1.0)
pH: 7 (ref 5.0–8.0)

## 2015-07-02 LAB — CBC WITH DIFFERENTIAL/PLATELET
BASOS ABS: 0.1 10*3/uL (ref 0.0–0.1)
BASOS PCT: 1 %
EOS PCT: 2 %
Eosinophils Absolute: 0.2 10*3/uL (ref 0.0–0.7)
HEMATOCRIT: 42.8 % (ref 36.0–46.0)
Hemoglobin: 14.7 g/dL (ref 12.0–15.0)
Lymphocytes Relative: 35 %
Lymphs Abs: 3.6 10*3/uL (ref 0.7–4.0)
MCH: 29.6 pg (ref 26.0–34.0)
MCHC: 34.3 g/dL (ref 30.0–36.0)
MCV: 86.1 fL (ref 78.0–100.0)
MONO ABS: 0.7 10*3/uL (ref 0.1–1.0)
MONOS PCT: 7 %
NEUTROS ABS: 5.8 10*3/uL (ref 1.7–7.7)
Neutrophils Relative %: 55 %
PLATELETS: 269 10*3/uL (ref 150–400)
RBC: 4.97 MIL/uL (ref 3.87–5.11)
RDW: 14 % (ref 11.5–15.5)
WBC: 10.3 10*3/uL (ref 4.0–10.5)

## 2015-07-02 LAB — COMPREHENSIVE METABOLIC PANEL
ALBUMIN: 4 g/dL (ref 3.5–5.0)
ALT: 26 U/L (ref 14–54)
ANION GAP: 10 (ref 5–15)
AST: 30 U/L (ref 15–41)
Alkaline Phosphatase: 105 U/L (ref 38–126)
BILIRUBIN TOTAL: 0.2 mg/dL — AB (ref 0.3–1.2)
BUN: 11 mg/dL (ref 6–20)
CHLORIDE: 103 mmol/L (ref 101–111)
CO2: 26 mmol/L (ref 22–32)
Calcium: 9.4 mg/dL (ref 8.9–10.3)
Creatinine, Ser: 0.68 mg/dL (ref 0.44–1.00)
GFR calc Af Amer: >60 mL/min (ref >60)
GLUCOSE: 112 mg/dL — AB (ref 65–99)
POTASSIUM: 4 mmol/L (ref 3.5–5.1)
Sodium: 139 mmol/L (ref 135–145)
TOTAL PROTEIN: 6.9 g/dL (ref 6.5–8.1)

## 2015-07-02 LAB — LIPASE, BLOOD: LIPASE: 36 U/L (ref 22–51)

## 2015-07-02 LAB — POC URINE PREG, ED: Preg Test, Ur: NEGATIVE

## 2015-07-02 MED ORDER — IOHEXOL 300 MG/ML  SOLN
100.0000 mL | Freq: Once | INTRAMUSCULAR | Status: AC | PRN
  Administered 2015-07-02: 100 mL via INTRAVENOUS

## 2015-07-02 MED ORDER — MAGNESIUM CITRATE PO SOLN
1.0000 | Freq: Once | ORAL | Status: AC
  Administered 2015-07-02: 1 via ORAL
  Filled 2015-07-02: qty 296

## 2015-07-02 MED ORDER — MAGNESIUM CITRATE PO SOLN: 1.0000 | Freq: Once | ORAL | Status: DC

## 2015-07-02 MED ORDER — SODIUM CHLORIDE 0.9 % IV BOLUS (SEPSIS)
1000.0000 mL | Freq: Once | INTRAVENOUS | Status: AC
Start: 2015-07-02 — End: 2015-07-02
  Administered 2015-07-02: 1000 mL via INTRAVENOUS

## 2015-07-02 NOTE — ED Notes (Signed)
Patient transported to CT 

## 2015-07-02 NOTE — Discharge Instructions (Signed)
Abdominal Pain, Women Jennifer Ho, Your CT scan results are below and only show constipation.  If the magnesium citrate does not help you have a bowel movement, take another dose tomorrow.  Consider taking over the counter miralax daily for your bowel movements.  See your primary care doctor within 3 days for close follow up.  If any symptoms worsen, come back to the ED immediately.  Thank you. Abdominal (stomach, pelvic, or belly) pain can be caused by many things. It is important to tell your doctor:  The location of the pain.  Does it come and go or is it present all the time?  Are there things that start the pain (eating certain foods, exercise)?  Are there other symptoms associated with the pain (fever, nausea, vomiting, diarrhea)? All of this is helpful to know when trying to find the cause of the pain. CAUSES   Stomach: virus or bacteria infection, or ulcer.  Intestine: appendicitis (inflamed appendix), regional ileitis (Crohn's disease), ulcerative colitis (inflamed colon), irritable bowel syndrome, diverticulitis (inflamed diverticulum of the colon), or cancer of the stomach or intestine.  Gallbladder disease or stones in the gallbladder.  Kidney disease, kidney stones, or infection.  Pancreas infection or cancer.  Fibromyalgia (pain disorder).  Diseases of the female organs:  Uterus: fibroid (non-cancerous) tumors or infection.  Fallopian tubes: infection or tubal pregnancy.  Ovary: cysts or tumors.  Pelvic adhesions (scar tissue).  Endometriosis (uterus lining tissue growing in the pelvis and on the pelvic organs).  Pelvic congestion syndrome (female organs filling up with blood just before the menstrual period).  Pain with the menstrual period.  Pain with ovulation (producing an egg).  Pain with an IUD (intrauterine device, birth control) in the uterus.  Cancer of the female organs.  Functional pain (pain not caused by a disease, may improve without  treatment).  Psychological pain.  Depression. DIAGNOSIS  Your doctor will decide the seriousness of your pain by doing an examination.  Blood tests.  X-rays.  Ultrasound.  CT scan (computed tomography, special type of X-ray).  MRI (magnetic resonance imaging).  Cultures, for infection.  Barium enema (dye inserted in the large intestine, to better view it with X-rays).  Colonoscopy (looking in intestine with a lighted tube).  Laparoscopy (minor surgery, looking in abdomen with a lighted tube).  Major abdominal exploratory surgery (looking in abdomen with a large incision). TREATMENT  The treatment will depend on the cause of the pain.   Many cases can be observed and treated at home.  Over-the-counter medicines recommended by your caregiver.  Prescription medicine.  Antibiotics, for infection.  Birth control pills, for painful periods or for ovulation pain.  Hormone treatment, for endometriosis.  Nerve blocking injections.  Physical therapy.  Antidepressants.  Counseling with a psychologist or psychiatrist.  Minor or major surgery. HOME CARE INSTRUCTIONS   Do not take laxatives, unless directed by your caregiver.  Take over-the-counter pain medicine only if ordered by your caregiver. Do not take aspirin because it can cause an upset stomach or bleeding.  Try a clear liquid diet (broth or water) as ordered by your caregiver. Slowly move to a bland diet, as tolerated, if the pain is related to the stomach or intestine.  Have a thermometer and take your temperature several times a day, and record it.  Bed rest and sleep, if it helps the pain.  Avoid sexual intercourse, if it causes pain.  Avoid stressful situations.  Keep your follow-up appointments and tests, as your caregiver  orders.  If the pain does not go away with medicine or surgery, you may try:  Acupuncture.  Relaxation exercises (yoga, meditation).  Group therapy.  Counseling. SEEK  MEDICAL CARE IF:   You notice certain foods cause stomach pain.  Your home care treatment is not helping your pain.  You need stronger pain medicine.  You want your IUD removed.  You feel faint or lightheaded.  You develop nausea and vomiting.  You develop a rash.  You are having side effects or an allergy to your medicine. SEEK IMMEDIATE MEDICAL CARE IF:   Your pain does not go away or gets worse.  You have a fever.  Your pain is felt only in portions of the abdomen. The right side could possibly be appendicitis. The left lower portion of the abdomen could be colitis or diverticulitis.  You are passing blood in your stools (bright red or black tarry stools, with or without vomiting).  You have blood in your urine.  You develop chills, with or without a fever.  You pass out. MAKE SURE YOU:   Understand these instructions.  Will watch your condition.  Will get help right away if you are not doing well or get worse. Document Released: 07/21/2007 Document Revised: 02/07/2014 Document Reviewed: 08/10/2009 [facility] Patient Information 2015 [place], [place]. This information is not intended to replace advice given to you by your health care provider. Make sure you discuss any questions you have with your health care provider. Constipation Constipation is when a person:  Poops (has a bowel movement) less than 3 times a week.  Has a hard time pooping.  Has poop that is dry, hard, or bigger than normal. HOME CARE   Eat foods with a lot of fiber in them. This includes fruits, vegetables, beans, and whole grains such as brown rice.  Avoid fatty foods and foods with a lot of sugar. This includes french fries, hamburgers, cookies, candy, and soda.  If you are not getting enough fiber from food, take products with added fiber in them (supplements).  Drink enough fluid to keep your pee (urine) clear or pale yellow.  Exercise on a regular basis, or as told by your  doctor.  Go to the restroom when you feel like you need to poop. Do not hold it.  Only take medicine as told by your doctor. Do not take medicines that help you poop (laxatives) without talking to your doctor first. GET HELP RIGHT AWAY IF:   You have bright red blood in your poop (stool).  Your constipation lasts more than 4 days or gets worse.  You have belly (abdominal) or butt (rectal) pain.  You have thin poop (as thin as a pencil).  You lose weight, and it cannot be explained. MAKE SURE YOU:   Understand these instructions.  Will watch your condition.  Will get help right away if you are not doing well or get worse. Document Released: 03/11/2008 Document Revised: 09/28/2013 Document Reviewed: 07/05/2013 [facility] Patient Information 2015 [place], [place]. This information is not intended to replace advice given to you by your health care provider. Make sure you discuss any questions you have with your health care provider.

## 2015-07-02 NOTE — ED Notes (Signed)
NAD at this time. PT alert x 4.  
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
## 2015-07-02 NOTE — ED Notes (Signed)
Pt reports constipation and abdominal pain/cramping ongoing since Tuesday afternoon. Pain decreased Wednesday and Thursday and got worse again. Go States she's bloated. Hx of fallopian inflammation that was causing significant pain for her, OB cleared for STD. Painful, "disgusting" tasting belches. No bowel movement since Friday morning.

## 2015-07-02 NOTE — ED Notes (Signed)
Pt also reports giving herself enema prior to pain, she states "illeffective." Reports gaining 5 pounds in last week.

## 2015-07-05 ENCOUNTER — Encounter: Payer: Self-pay | Admitting: Obstetrics and Gynecology

## 2015-07-05 ENCOUNTER — Ambulatory Visit (INDEPENDENT_AMBULATORY_CARE_PROVIDER_SITE_OTHER): Payer: 59 | Admitting: Obstetrics and Gynecology

## 2015-07-05 VITALS — BP 98/60 | HR 64 | Resp 14 | Wt 172.0 lb

## 2015-07-05 DIAGNOSIS — Z8742 Personal history of other diseases of the female genital tract: Secondary | ICD-10-CM | POA: Diagnosis not present

## 2015-07-05 DIAGNOSIS — N809 Endometriosis, unspecified: Secondary | ICD-10-CM

## 2015-07-05 DIAGNOSIS — R1031 Right lower quadrant pain: Secondary | ICD-10-CM

## 2015-07-05 DIAGNOSIS — R599 Enlarged lymph nodes, unspecified: Secondary | ICD-10-CM | POA: Diagnosis not present

## 2015-07-05 DIAGNOSIS — R590 Localized enlarged lymph nodes: Secondary | ICD-10-CM
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
## 2015-07-19 ENCOUNTER — Telehealth: Payer: Self-pay | Admitting: Obstetrics and Gynecology

## 2015-07-19 DIAGNOSIS — R1031 Right lower quadrant pain: Secondary | ICD-10-CM

## 2015-07-19 NOTE — Telephone Encounter (Signed)
Please inform the patient that I have reviewed the labs she had sent over (thanks for sending them). Her progesterone and estradiol levels were normal both in July and September based on where she was in her cycle. I wouldn't recommend she take supplemental progesterone. Her testosterone levels were normal. I don't see a TSH but her T3 and T4 were in the normal range. I would definitely recommend she have a TSH if she is on supplemental thyroid medication.

## 2015-07-20 NOTE — Telephone Encounter (Signed)
Call to patient with message from Dr. Oscar LaJertson. Patient verbalized understanding and will follow up with TSH at another office visit.  Patient is scheduled for follow up ultrasound in our office with Dr. Oscar LaJertson for 08/22/15.  cc Lilyan GilfordBecky Frahm for insurance pre-certification and patient contact. Patient also wishes to know if she has a balance from prior office visit with ultrasound.   Routing to provider for final review. Patient agreeable to disposition. Will close encounter.

## 2015-08-14 NOTE — Addendum Note (Signed)
Addended by: Joeseph AmorFAST, Christyl Osentoski L on: 08/14/2015 01:51 PM   Modules accepted: Orders

## 2015-08-22 ENCOUNTER — Ambulatory Visit (INDEPENDENT_AMBULATORY_CARE_PROVIDER_SITE_OTHER): Payer: 59

## 2015-08-22 ENCOUNTER — Ambulatory Visit (INDEPENDENT_AMBULATORY_CARE_PROVIDER_SITE_OTHER): Payer: 59 | Admitting: Obstetrics and Gynecology

## 2015-08-22 ENCOUNTER — Encounter: Payer: Self-pay | Admitting: Obstetrics and Gynecology

## 2015-08-22 VITALS — BP 98/60 | HR 64 | Resp 14 | Wt 169.0 lb

## 2015-08-22 DIAGNOSIS — N7011 Chronic salpingitis: Secondary | ICD-10-CM

## 2015-08-22 DIAGNOSIS — R1031 Right lower quadrant pain: Secondary | ICD-10-CM

## 2015-08-22 DIAGNOSIS — N926 Irregular menstruation, unspecified: Secondary | ICD-10-CM

## 2015-08-22 DIAGNOSIS — N946 Dysmenorrhea, unspecified: Secondary | ICD-10-CM
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
## 2015-08-23 ENCOUNTER — Ambulatory Visit (INDEPENDENT_AMBULATORY_CARE_PROVIDER_SITE_OTHER): Payer: 59 | Admitting: Obstetrics and Gynecology

## 2015-08-23 ENCOUNTER — Encounter: Payer: Self-pay | Admitting: Obstetrics and Gynecology

## 2015-08-23 ENCOUNTER — Telehealth: Payer: Self-pay | Admitting: *Deleted

## 2015-08-23 VITALS — BP 110/60 | HR 84 | Resp 16 | Wt 168.0 lb

## 2015-08-23 DIAGNOSIS — R599 Enlarged lymph nodes, unspecified: Secondary | ICD-10-CM

## 2015-08-23 DIAGNOSIS — R591 Generalized enlarged lymph nodes: Secondary | ICD-10-CM

## 2015-08-23 NOTE — Telephone Encounter (Signed)
-----   Message from Romualdo BolkJill Evelyn Jertson, MD sent at 08/22/2015  6:02 PM EST ----- I forgot to check her previously noted enlarged lymph note at her visit. Please ask her to come back at her convenience for a check. It should be a very short visit, can double book. Thanks, Noreene LarssonJill

## 2015-08-23 NOTE — Telephone Encounter (Signed)
Spoke with patient and set her up to come back today to be rechecked
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
## 2015-08-23 NOTE — Telephone Encounter (Signed)
08-23-15  LMTC -eh

## 2015-08-24 ENCOUNTER — Other Ambulatory Visit: Payer: Self-pay

## 2015-08-28 ENCOUNTER — Encounter: Payer: Self-pay | Admitting: Podiatry

## 2015-08-28 ENCOUNTER — Ambulatory Visit (INDEPENDENT_AMBULATORY_CARE_PROVIDER_SITE_OTHER): Payer: 59 | Admitting: Podiatry

## 2015-08-28 VITALS — BP 97/63 | HR 66 | Resp 16 | Ht 66.0 in | Wt 165.0 lb

## 2015-08-28 DIAGNOSIS — B353 Tinea pedis: Secondary | ICD-10-CM | POA: Diagnosis not present

## 2015-08-28 DIAGNOSIS — B351 Tinea unguium: Secondary | ICD-10-CM

## 2015-08-28 MED ORDER — TERBINAFINE HCL 250 MG PO TABS: ORAL_TABLET | ORAL | Status: DC

## 2015-08-28 NOTE — Progress Notes (Signed)
   Subjective:    Patient ID: Jennifer Ho, female    DOB: 12/01/1990, 24 y.o.   MRN: [id number]  HPI  Patient presents with a nail problem on their right foot, great toe-medial side (Nail fungus). Pt stated, "has used tea tree oil on nail with no changes"; x6 months.  Review of Systems  Eyes: Positive for redness.  Cardiovascular: Positive for leg swelling.  Endocrine: Positive for polyuria.  Genitourinary: Positive for urgency and frequency.  Musculoskeletal: Positive for myalgias and arthralgias.  All other systems reviewed and are negative.      Objective:   Physical Exam        Assessment & Plan:

## 2015-08-29 NOTE — Progress Notes (Signed)
Subjective:     Patient ID: Jennifer Ho, female   DOB: 12/14/1990, 24 y.o.   MRN: [id number]  HPI patient points to the big toe lateral side stating there is some discoloration and she is concerned about this   Review of Systems  All other systems reviewed and are negative.      Objective:   Physical Exam  Constitutional: She is oriented to person, place, and time.  Cardiovascular: Intact distal pulses.   Musculoskeletal: Normal range of motion.  Neurological: She is oriented to person, place, and time.  Skin: Skin is warm.  Nursing note and vitals reviewed.  neurovascular status intact muscle strength adequate range of motion within normal limits with patient noted to have on the lateral side of the right hallux nail discoloration along the lateral bed. Does not remember specific trauma and states that she's had fungus in her skin which was cultured and found to be positive. She is very concerned about the nail and was noted to have good digital perfusion and is well oriented 3     Assessment:     Possibility for mycotic infection of the lateral nail fold right big toe    Plan:     H&P condition reviewed and we are getting a culture. We will begin her on oral pulse therapy along with topical medication with consideration of laser at one point in the future. Reappoint to recheck in 3-4 months to read viewed the results

## 2015-09-04 ENCOUNTER — Ambulatory Visit: Payer: 59 | Admitting: Podiatry

## 2015-09-22 ENCOUNTER — Telehealth: Payer: Self-pay | Admitting: *Deleted

## 2015-09-22 NOTE — Telephone Encounter (Signed)
Dr. Charlsie Merlesegal reviewed fungal culture 08/28/2015 as negative for fungus, + for trauma, no treatment is necessary.

## 2016-02-19 ENCOUNTER — Ambulatory Visit: Payer: 59 | Admitting: Neurology

## 2016-04-30 ENCOUNTER — Ambulatory Visit: Payer: 59 | Admitting: Obstetrics & Gynecology

## 2016-12-21 IMAGING — CT CT ABD-PELV W/ CM
2 of 4 series · 16 of 46 positions shown, 18 images · IV contrast (APPLIED)
Comparison: 08/31/2014

CLINICAL DATA: Mid epigastric abdominal pain, cramping ongoing
since [REDACTED].

EXAM:
CT ABDOMEN AND PELVIS WITH CONTRAST
TECHNIQUE: Multidetector CT imaging of the abdomen and pelvis was performed
using the standard protocol following bolus administration of
intravenous contrast.
CONTRAST:  100mL OMNIPAQUE IOHEXOL 300 MG/ML  SOLN

[Series 2: abd/ pelvis 5.0 i30f 1 · axial · 0.67mm/px · z∈[+694,+1150]mm · 13 of 99 slices shown, 15 images]
[im 4/99  soft-tissue]
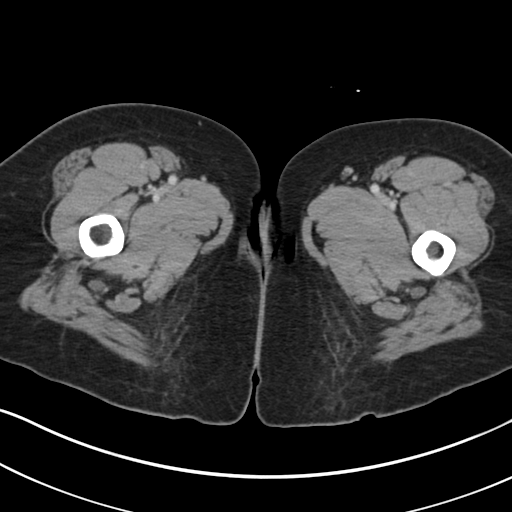
[im 4/99  bone]
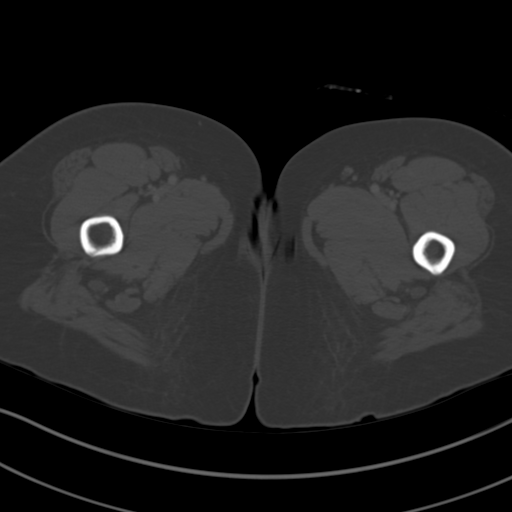
[im 12/99  soft-tissue]
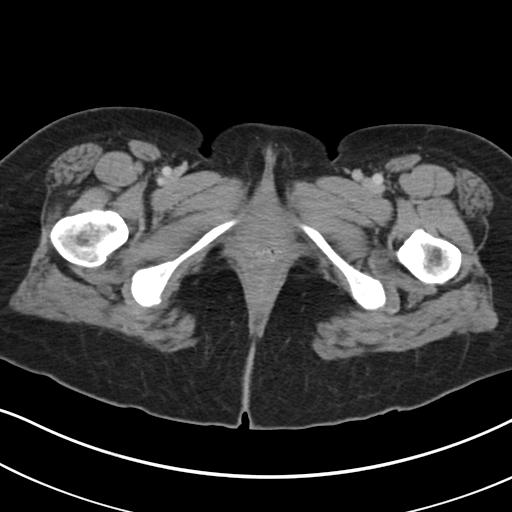
[im 20/99  soft-tissue]
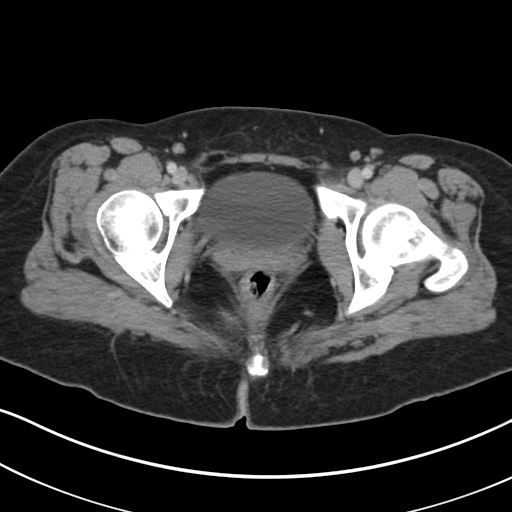
[im 28/99  soft-tissue]
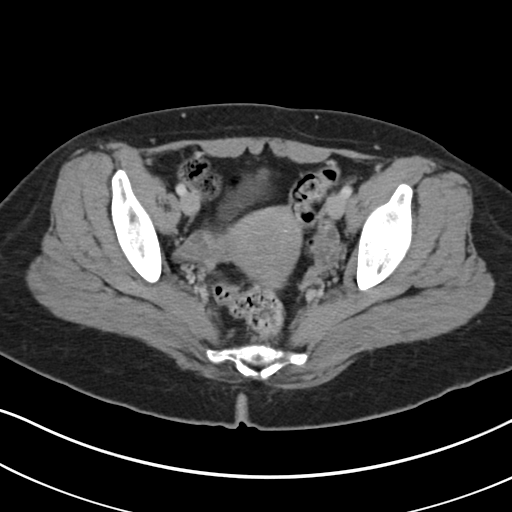
[im 36/99  soft-tissue]
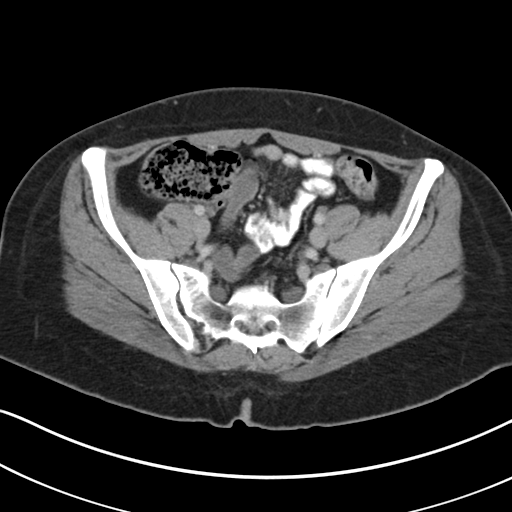
[im 44/99  soft-tissue]
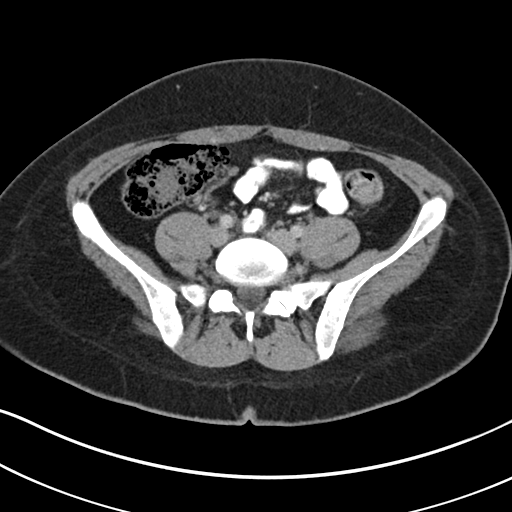
[im 51/99  soft-tissue]
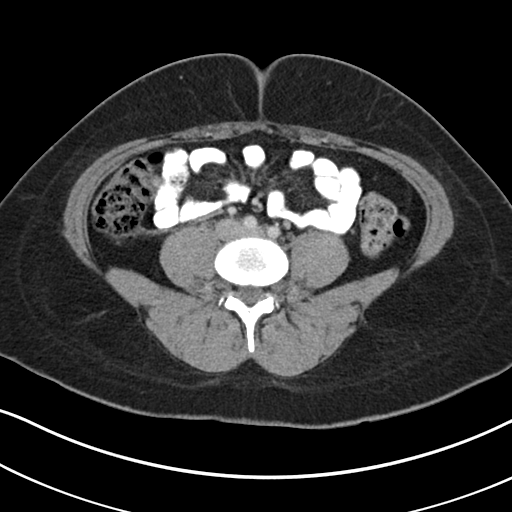
[im 55/99  soft-tissue]
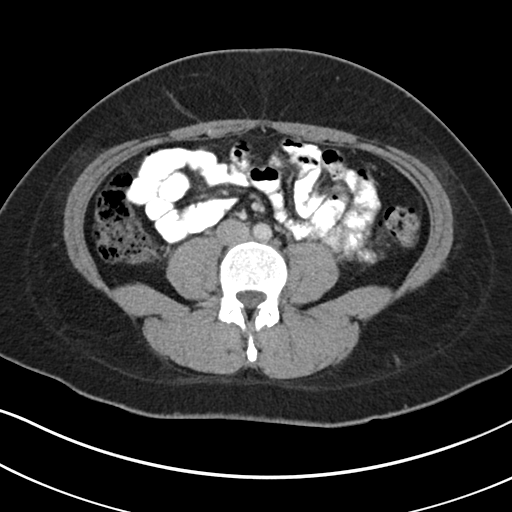
[im 63/99  soft-tissue]
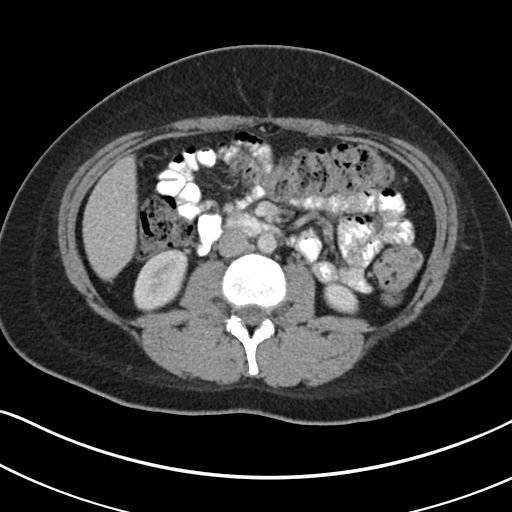
[im 63/99  bone]
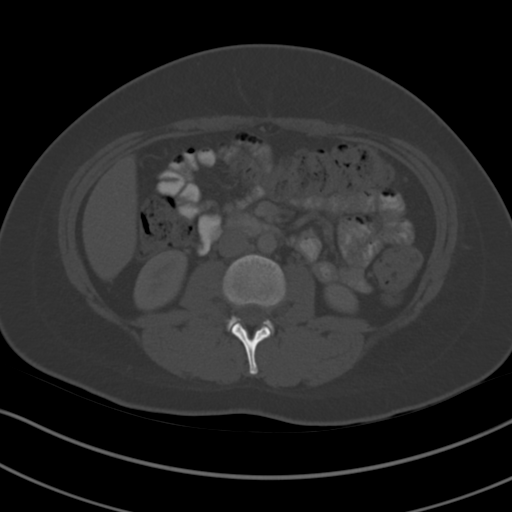
[im 71/99  soft-tissue]
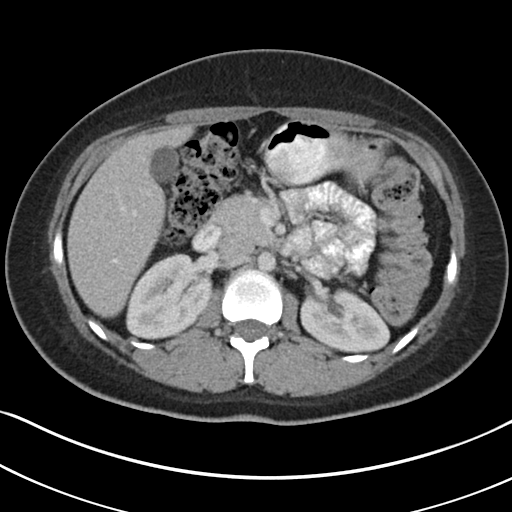
[im 79/99  soft-tissue]
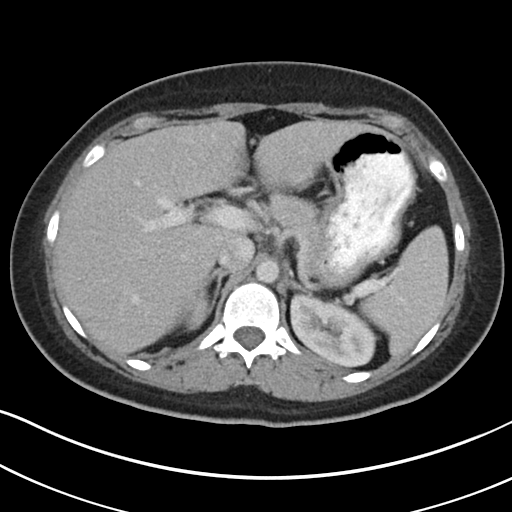
[im 87/99  soft-tissue]
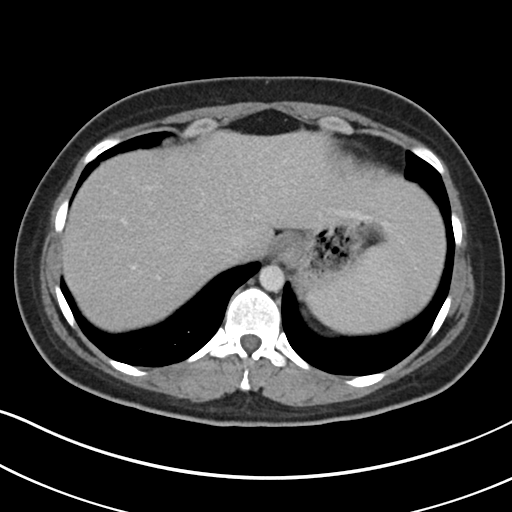
[im 95/99  soft-tissue]
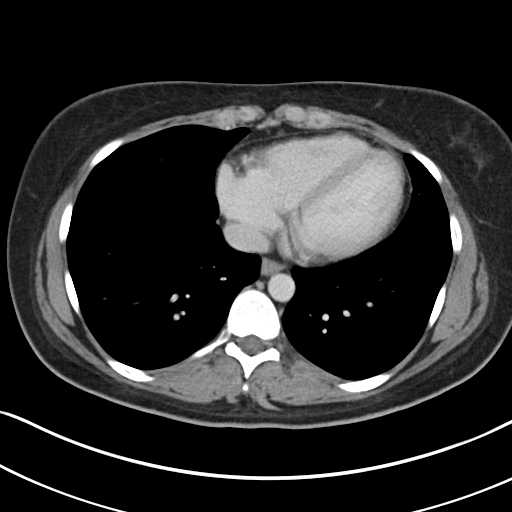

[Series 5: coronal soft tissue · coronal · 0.71mm/px · 3 of 71 slices shown]
[im 24/71  soft-tissue]
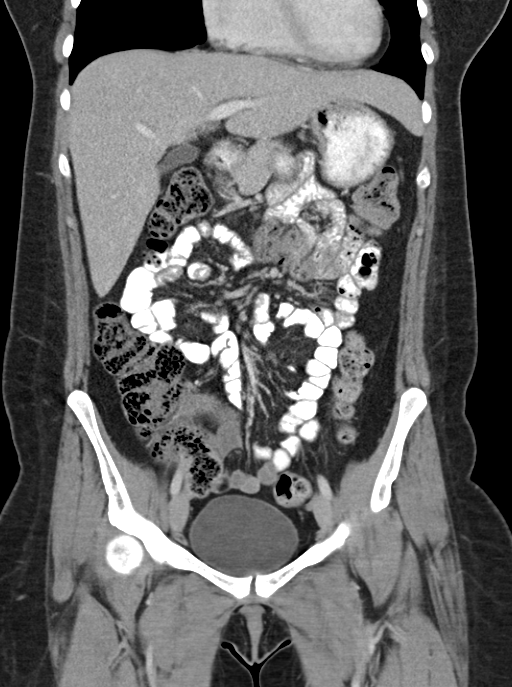
[im 32/71  soft-tissue]
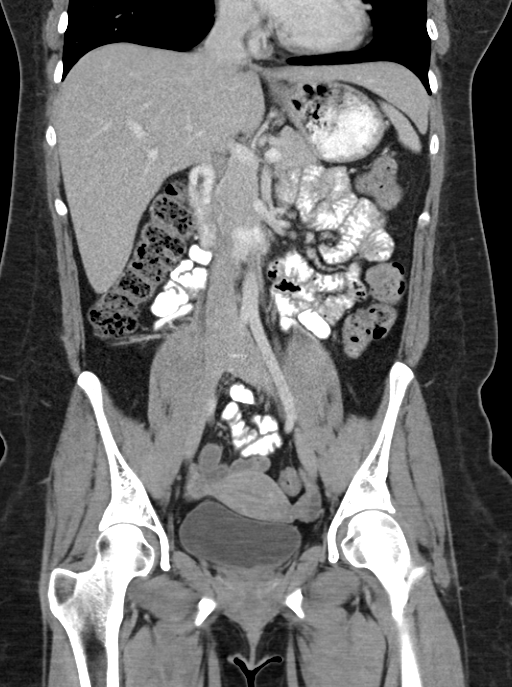
[im 39/71  soft-tissue]
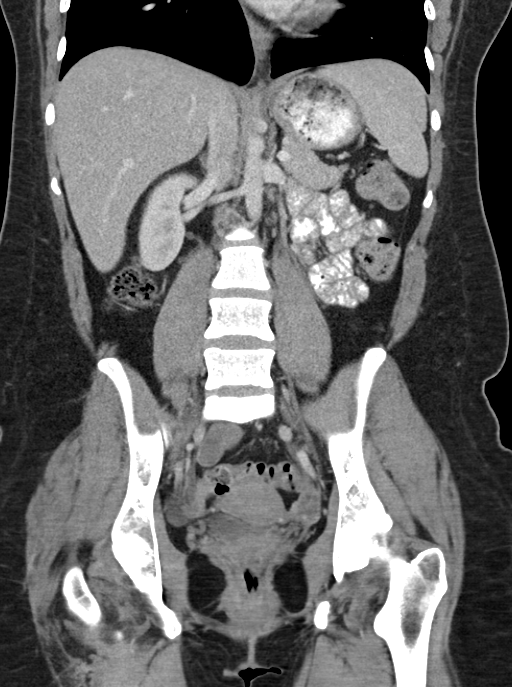

[16 of 46 positions shown; findings below may reference images not displayed]

FINDINGS: Lung bases are clear.  No effusions.  Heart is normal size.

Liver, gallbladder, spleen, pancreas, adrenals and kidneys are
normal. Appendix is visualized and is normal. Large stool burden in
the colon. Stomach, large and small bowel otherwise unremarkable.
Aorta is normal caliber. No free fluid, free air or adenopathy.
Uterus, adnexae and urinary bladder normal.

No acute bony abnormality or focal bone lesion.
IMPRESSION: No acute findings in the abdomen or pelvis.

Large stool burden.

## 2018-08-25 DIAGNOSIS — F331 Major depressive disorder, recurrent, moderate: Secondary | ICD-10-CM

## 2018-08-25 DIAGNOSIS — F4310 Post-traumatic stress disorder, unspecified: Secondary | ICD-10-CM

## 2018-08-26 ENCOUNTER — Ambulatory Visit: Payer: BLUE CROSS/BLUE SHIELD | Admitting: Psychiatry

## 2018-08-26 ENCOUNTER — Encounter: Payer: Self-pay | Admitting: Psychiatry

## 2018-08-26 DIAGNOSIS — F3181 Bipolar II disorder: Secondary | ICD-10-CM

## 2018-08-26 DIAGNOSIS — F5105 Insomnia due to other mental disorder: Secondary | ICD-10-CM

## 2018-08-26 DIAGNOSIS — F4310 Post-traumatic stress disorder, unspecified: Secondary | ICD-10-CM

## 2018-08-26 MED ORDER — TRAZODONE HCL 50 MG PO TABS: 50.0000 mg | ORAL_TABLET | Freq: Every evening | ORAL | 0 refills | Status: DC | PRN

## 2018-08-26 MED ORDER — RISPERIDONE 0.25 MG PO TABS: 0.2500 mg | ORAL_TABLET | Freq: Two times a day (BID) | ORAL | 0 refills | Status: DC
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
## 2018-10-14 ENCOUNTER — Encounter: Payer: Self-pay | Admitting: Psychiatry

## 2018-10-14 ENCOUNTER — Ambulatory Visit: Payer: 59 | Admitting: Psychiatry

## 2018-10-14 ENCOUNTER — Ambulatory Visit: Payer: BLUE CROSS/BLUE SHIELD | Admitting: Psychiatry

## 2018-10-14 DIAGNOSIS — F5105 Insomnia due to other mental disorder: Secondary | ICD-10-CM

## 2018-10-14 DIAGNOSIS — F3181 Bipolar II disorder: Secondary | ICD-10-CM

## 2018-10-14 DIAGNOSIS — F4310 Post-traumatic stress disorder, unspecified: Secondary | ICD-10-CM | POA: Diagnosis not present

## 2018-10-14 MED ORDER — LAMOTRIGINE 200 MG PO TABS: 200.0000 mg | ORAL_TABLET | Freq: Every day | ORAL | 1 refills | Status: DC
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
## 2018-11-19 ENCOUNTER — Other Ambulatory Visit: Payer: Self-pay | Admitting: Psychiatry

## 2018-12-21 ENCOUNTER — Telehealth: Payer: Self-pay | Admitting: Psychiatry

## 2018-12-21 NOTE — Telephone Encounter (Signed)
Easiest solution would be to increase risperidone to 0.25 mg in the morning and 0.5 mg at night.  She has a 0.25 mg tablets so that would be 3 a day.  Alternatively she could just take all 3 at night if she preferred or if it made her sleepy to take in the day.  Let me know if that does not work well.  Meredith Staggers MD, DFAPA

## 2018-12-21 NOTE — Telephone Encounter (Signed)
Pt would like to up her dosage of Risperdal or get a new rx than would help with her anxiety. Send to her regular pharmacy.

## 2018-12-22 NOTE — Telephone Encounter (Signed)
Left voice mail to call back 

## 2018-12-22 NOTE — Telephone Encounter (Signed)
Pt given information and verbalized understanding. Instructed to call back with any problems or concerns.

## 2019-02-03 ENCOUNTER — Telehealth: Payer: Self-pay | Admitting: Psychiatry

## 2019-02-03 NOTE — Telephone Encounter (Signed)
This patient not seen since Jan 2020. Calls requesting to see if CC can do FMLA paperwork for her. She is isolated working from home, having a difficult time working this way. Usually gets therapy and massage for chronic pain and can't do that. Struggling with depression, some SI, and can't push thru. Work recommended the [employer] option. I scheduled her for tomorrow  4/30 for you to assess her current symptoms.

## 2019-02-04 ENCOUNTER — Ambulatory Visit: Payer: 59 | Admitting: Psychiatry

## 2019-02-04 ENCOUNTER — Encounter: Payer: Self-pay | Admitting: Psychiatry

## 2019-02-04 ENCOUNTER — Other Ambulatory Visit: Payer: Self-pay

## 2019-02-04 DIAGNOSIS — F5105 Insomnia due to other mental disorder: Secondary | ICD-10-CM

## 2019-02-04 DIAGNOSIS — F3181 Bipolar II disorder: Secondary | ICD-10-CM

## 2019-02-04 DIAGNOSIS — F4310 Post-traumatic stress disorder, unspecified: Secondary | ICD-10-CM

## 2019-02-04 MED ORDER — LITHIUM CARBONATE 150 MG PO CAPS: 150.0000 mg | ORAL_CAPSULE | Freq: Every day | ORAL | 5 refills | Status: DC

## 2019-02-04 MED ORDER — LAMOTRIGINE 200 MG PO TABS: 200.0000 mg | ORAL_TABLET | Freq: Every day | ORAL | 1 refills | Status: DC
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
## 2019-02-17 ENCOUNTER — Ambulatory Visit: Payer: 59 | Admitting: Psychiatry

## 2019-03-18 ENCOUNTER — Encounter: Payer: Self-pay | Admitting: Psychiatry

## 2019-03-18 ENCOUNTER — Ambulatory Visit (INDEPENDENT_AMBULATORY_CARE_PROVIDER_SITE_OTHER): Payer: 59 | Admitting: Psychiatry

## 2019-03-18 ENCOUNTER — Other Ambulatory Visit: Payer: Self-pay

## 2019-03-18 DIAGNOSIS — F331 Major depressive disorder, recurrent, moderate: Secondary | ICD-10-CM | POA: Diagnosis not present

## 2019-03-18 DIAGNOSIS — F4310 Post-traumatic stress disorder, unspecified: Secondary | ICD-10-CM

## 2019-03-18 DIAGNOSIS — M797 Fibromyalgia: Secondary | ICD-10-CM

## 2019-03-18 MED ORDER — MIRTAZAPINE 15 MG PO TABS: 15.0000 mg | ORAL_TABLET | Freq: Every day | ORAL | 1 refills | Status: DC
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
## 2019-05-04 ENCOUNTER — Ambulatory Visit (INDEPENDENT_AMBULATORY_CARE_PROVIDER_SITE_OTHER): Payer: 59 | Admitting: Psychiatry

## 2019-05-04 ENCOUNTER — Encounter: Payer: Self-pay | Admitting: Psychiatry

## 2019-05-04 ENCOUNTER — Other Ambulatory Visit: Payer: Self-pay

## 2019-05-04 DIAGNOSIS — F331 Major depressive disorder, recurrent, moderate: Secondary | ICD-10-CM

## 2019-05-04 DIAGNOSIS — F5105 Insomnia due to other mental disorder: Secondary | ICD-10-CM | POA: Diagnosis not present

## 2019-05-04 DIAGNOSIS — M797 Fibromyalgia: Secondary | ICD-10-CM

## 2019-05-04 DIAGNOSIS — F4310 Post-traumatic stress disorder, unspecified: Secondary | ICD-10-CM

## 2019-05-04 MED ORDER — MIRTAZAPINE 15 MG PO TABS: 15.0000 mg | ORAL_TABLET | Freq: Every day | ORAL | 2 refills | Status: DC
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
## 2019-06-02 ENCOUNTER — Other Ambulatory Visit: Payer: Self-pay | Admitting: Psychiatry

## 2019-06-02 NOTE — Telephone Encounter (Signed)
Last note mentions 0.75 mg ?

## 2019-08-05 ENCOUNTER — Other Ambulatory Visit: Payer: Self-pay | Admitting: Psychiatry

## 2020-03-14 ENCOUNTER — Telehealth (INDEPENDENT_AMBULATORY_CARE_PROVIDER_SITE_OTHER): Payer: BC Managed Care – PPO | Admitting: Psychiatry

## 2020-03-14 ENCOUNTER — Encounter: Payer: Self-pay | Admitting: Psychiatry

## 2020-03-14 DIAGNOSIS — F5105 Insomnia due to other mental disorder: Secondary | ICD-10-CM | POA: Diagnosis not present

## 2020-03-14 DIAGNOSIS — F331 Major depressive disorder, recurrent, moderate: Secondary | ICD-10-CM

## 2020-03-14 DIAGNOSIS — M797 Fibromyalgia: Secondary | ICD-10-CM

## 2020-03-14 DIAGNOSIS — F4310 Post-traumatic stress disorder, unspecified: Secondary | ICD-10-CM
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
## 2020-05-11 ENCOUNTER — Other Ambulatory Visit: Payer: Self-pay

## 2020-05-11 ENCOUNTER — Encounter: Payer: Self-pay | Admitting: Psychiatry

## 2020-05-11 ENCOUNTER — Ambulatory Visit (INDEPENDENT_AMBULATORY_CARE_PROVIDER_SITE_OTHER): Payer: BC Managed Care – PPO | Admitting: Psychiatry

## 2020-05-11 DIAGNOSIS — F4310 Post-traumatic stress disorder, unspecified: Secondary | ICD-10-CM | POA: Diagnosis not present

## 2020-05-11 DIAGNOSIS — F331 Major depressive disorder, recurrent, moderate: Secondary | ICD-10-CM | POA: Diagnosis not present

## 2020-05-11 DIAGNOSIS — F5105 Insomnia due to other mental disorder: Secondary | ICD-10-CM | POA: Diagnosis not present

## 2020-05-11 MED ORDER — RISPERIDONE 0.25 MG PO TABS: ORAL_TABLET | ORAL | 1 refills | Status: DC

## 2020-05-11 NOTE — Patient Instructions (Signed)
[facility]
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
## 2020-05-12 ENCOUNTER — Other Ambulatory Visit: Payer: Self-pay

## 2020-05-12 ENCOUNTER — Telehealth: Payer: Self-pay | Admitting: Psychiatry

## 2020-05-12 DIAGNOSIS — F4310 Post-traumatic stress disorder, unspecified: Secondary | ICD-10-CM

## 2020-05-12 MED ORDER — RISPERIDONE 0.5 MG PO TABS: ORAL_TABLET | ORAL | 1 refills | Status: DC

## 2020-05-12 NOTE — Telephone Encounter (Signed)
Patient is correct per office note yesterday from Dr. Jennelle Human, updated Rx sent.

## 2020-05-12 NOTE — Telephone Encounter (Signed)
Jennifer Ho called saying the wrong dosing of the risperidone was sent in.  Her understanding was the dose was going to be increased to 0.5 mg 3-4 tabs/day.  What was called in was the old dose of 0.25mg .  Please send in the correct dose.  [facility] on [address]

## 2020-05-16 ENCOUNTER — Telehealth: Payer: Self-pay | Admitting: Psychiatry

## 2020-05-16 ENCOUNTER — Other Ambulatory Visit: Payer: Self-pay | Admitting: Psychiatry

## 2020-05-16 DIAGNOSIS — F4310 Post-traumatic stress disorder, unspecified: Secondary | ICD-10-CM

## 2020-05-16 MED ORDER — LAMOTRIGINE 200 MG PO TABS: 200.0000 mg | ORAL_TABLET | Freq: Every day | ORAL | 1 refills | Status: DC

## 2020-05-16 MED ORDER — RISPERIDONE 1 MG PO TABS: ORAL_TABLET | ORAL | 1 refills | Status: DC

## 2020-05-16 NOTE — Telephone Encounter (Signed)
Noted thank you. I will cancel PA.

## 2020-05-16 NOTE — Telephone Encounter (Signed)
Patient is correct, I do have a pending PA for her but might be easier to change strength? Currently it's Risperidone 0.5 mg take 3-4 at hs.

## 2020-05-16 NOTE — Telephone Encounter (Signed)
Don't do PA.  Let's change to 1 mg 1 and 1/2 daily and that should work.  I'll send in new RX

## 2020-05-16 NOTE — Telephone Encounter (Signed)
Pt said that her insurance will not cover her Risperdal unless a new rx is sent in. She said that they will pay if it is written take 1.0mg  twice a day. Pt also asked that you send her rx for Subvenite 200mg  to [facility] at [address]

## 2020-05-17 ENCOUNTER — Telehealth: Payer: Self-pay | Admitting: Psychiatry

## 2020-05-17 NOTE — Telephone Encounter (Signed)
I called the rep for Subvenite Teodora Medici [phone] and LM about this patient's difficulty finding it and asked him to call Jola Babinski with details as to how to get it.  I suspect the pharmacy is just not wanting to order it.

## 2020-05-17 NOTE — Telephone Encounter (Signed)
Pt left message stating if you send Rx for Lamotrigine 200 mg 2/d 1 yr Rx to [facility] in [place] they can fill. Can't locate  Subvenite. Pt understands she will have to slowly start taking new Rx to avoid withdrawals. Any questions call pt @ [phone]

## 2020-05-22 NOTE — Telephone Encounter (Signed)
The new Drug Rep is Hyman Hopes.  She called me back with these pharmacies in [place] that have the medication on their shelves.  [facility] [address] [place]; [facility] [address] [place], [place]: [facility] on [place], [place]

## 2020-05-22 NOTE — Telephone Encounter (Signed)
Call the patient and give her the names of these pharmacies where she can find Subvenite which is a branded version of lamotrigine.

## 2020-05-23 NOTE — Telephone Encounter (Signed)
Call the pt with the names of the pharmacies where she can get Subvenite

## 2020-05-25 ENCOUNTER — Telehealth: Payer: Self-pay | Admitting: Psychiatry

## 2020-05-25 ENCOUNTER — Other Ambulatory Visit: Payer: Self-pay

## 2020-05-25 MED ORDER — SUBVENITE 200 MG PO TABS: 200.0000 mg | ORAL_TABLET | Freq: Two times a day (BID) | ORAL | 1 refills | Status: DC

## 2020-05-25 NOTE — Telephone Encounter (Signed)
Jennifer Ho called to get the names of the pharmacies for the Jennifer Ho.  I gave her the choices and she said the [facility] on [address] would be closest to her.  Please send the prescription there.  She asked that it be written for the 200mg  2/day DAW so they won't try to give her something else.

## 2020-05-25 NOTE — Telephone Encounter (Signed)
Pended for DAW for Dr. Jennelle Human to review and send

## 2020-05-25 NOTE — Telephone Encounter (Signed)
Left patient voicemail to call back with pharmacies carrying [facility]

## 2020-05-26 ENCOUNTER — Telehealth: Payer: Self-pay

## 2020-05-26 NOTE — Telephone Encounter (Signed)
Prior authorization submitted and approved for RISPERIDONE 0.5 MG for quantity effective 05/22/2020-05/22/2021 with [employer].  Rx has been since changed to Risperidone 1 mg

## 2020-05-31 NOTE — Telephone Encounter (Signed)
Patient called back last week and is aware of pharmacies

## 2020-07-25 ENCOUNTER — Encounter: Payer: Self-pay | Admitting: Psychiatry

## 2020-07-25 ENCOUNTER — Other Ambulatory Visit: Payer: Self-pay

## 2020-07-25 ENCOUNTER — Ambulatory Visit (INDEPENDENT_AMBULATORY_CARE_PROVIDER_SITE_OTHER): Payer: BC Managed Care – PPO | Admitting: Psychiatry

## 2020-07-25 DIAGNOSIS — F4310 Post-traumatic stress disorder, unspecified: Secondary | ICD-10-CM

## 2020-07-25 DIAGNOSIS — F331 Major depressive disorder, recurrent, moderate: Secondary | ICD-10-CM

## 2020-07-25 DIAGNOSIS — M797 Fibromyalgia: Secondary | ICD-10-CM | POA: Diagnosis not present

## 2020-07-25 DIAGNOSIS — F5105 Insomnia due to other mental disorder: Secondary | ICD-10-CM | POA: Diagnosis not present

## 2020-07-25 MED ORDER — LORAZEPAM 1 MG PO TABS: ORAL_TABLET | ORAL | 0 refills | Status: DC
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
## 2020-08-09 ENCOUNTER — Other Ambulatory Visit: Payer: Self-pay

## 2020-08-09 ENCOUNTER — Ambulatory Visit (INDEPENDENT_AMBULATORY_CARE_PROVIDER_SITE_OTHER): Payer: BC Managed Care – PPO | Admitting: Psychiatry

## 2020-08-09 ENCOUNTER — Encounter: Payer: Self-pay | Admitting: Psychiatry

## 2020-08-09 DIAGNOSIS — F331 Major depressive disorder, recurrent, moderate: Secondary | ICD-10-CM | POA: Diagnosis not present

## 2020-08-09 DIAGNOSIS — F4310 Post-traumatic stress disorder, unspecified: Secondary | ICD-10-CM

## 2020-08-09 DIAGNOSIS — M797 Fibromyalgia: Secondary | ICD-10-CM | POA: Diagnosis not present

## 2020-08-09 DIAGNOSIS — R7989 Other specified abnormal findings of blood chemistry: Secondary | ICD-10-CM

## 2020-08-09 DIAGNOSIS — F5105 Insomnia due to other mental disorder: Secondary | ICD-10-CM

## 2020-08-09 MED ORDER — ARIPIPRAZOLE 5 MG PO TABS: 5.0000 mg | ORAL_TABLET | Freq: Every day | ORAL | 1 refills | Status: DC
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
## 2020-09-05 ENCOUNTER — Other Ambulatory Visit: Payer: Self-pay | Admitting: Psychiatry

## 2020-09-05 DIAGNOSIS — F4310 Post-traumatic stress disorder, unspecified: Secondary | ICD-10-CM

## 2020-09-11 ENCOUNTER — Ambulatory Visit (INDEPENDENT_AMBULATORY_CARE_PROVIDER_SITE_OTHER): Payer: BC Managed Care – PPO | Admitting: Psychiatry

## 2020-09-11 ENCOUNTER — Encounter: Payer: Self-pay | Admitting: Psychiatry

## 2020-09-11 ENCOUNTER — Other Ambulatory Visit: Payer: Self-pay

## 2020-09-11 DIAGNOSIS — F331 Major depressive disorder, recurrent, moderate: Secondary | ICD-10-CM | POA: Diagnosis not present

## 2020-09-11 DIAGNOSIS — R7989 Other specified abnormal findings of blood chemistry: Secondary | ICD-10-CM

## 2020-09-11 DIAGNOSIS — F5105 Insomnia due to other mental disorder: Secondary | ICD-10-CM

## 2020-09-11 DIAGNOSIS — F4310 Post-traumatic stress disorder, unspecified: Secondary | ICD-10-CM | POA: Diagnosis not present

## 2020-09-11 DIAGNOSIS — M797 Fibromyalgia: Secondary | ICD-10-CM | POA: Diagnosis not present

## 2020-09-11 MED ORDER — ARIPIPRAZOLE 10 MG PO TABS: 10.0000 mg | ORAL_TABLET | Freq: Every day | ORAL | 0 refills | Status: DC

## 2020-09-11 NOTE — Patient Instructions (Addendum)
Increase Abilify to 10 mg daily Reduce risperidone to 1 mg daily.  After Christmas if there are no difficulties then stop risperidone
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
## 2020-09-12 ENCOUNTER — Encounter: Payer: Self-pay | Admitting: Psychiatry

## 2020-10-26 ENCOUNTER — Telehealth: Payer: Self-pay | Admitting: Psychiatry

## 2020-10-26 NOTE — Telephone Encounter (Signed)
Pt called reporting anxiety has gotten much worse. Asking if she should increase Abilify, now taking 10 mg and has apt 1/31 Pt # [phone]

## 2020-10-27 NOTE — Telephone Encounter (Signed)
Yes, increase Abilify to 15 mg or 1 and 1/2 of the 10 mg tablets

## 2020-11-06 ENCOUNTER — Encounter: Payer: Self-pay | Admitting: Psychiatry

## 2020-11-06 ENCOUNTER — Ambulatory Visit (INDEPENDENT_AMBULATORY_CARE_PROVIDER_SITE_OTHER): Payer: BC Managed Care – PPO | Admitting: Psychiatry

## 2020-11-06 ENCOUNTER — Other Ambulatory Visit: Payer: Self-pay

## 2020-11-06 DIAGNOSIS — R7989 Other specified abnormal findings of blood chemistry: Secondary | ICD-10-CM

## 2020-11-06 DIAGNOSIS — F5105 Insomnia due to other mental disorder: Secondary | ICD-10-CM | POA: Diagnosis not present

## 2020-11-06 DIAGNOSIS — F331 Major depressive disorder, recurrent, moderate: Secondary | ICD-10-CM | POA: Diagnosis not present

## 2020-11-06 DIAGNOSIS — F4310 Post-traumatic stress disorder, unspecified: Secondary | ICD-10-CM | POA: Diagnosis not present

## 2020-11-06 MED ORDER — ARIPIPRAZOLE 15 MG PO TABS: 15.0000 mg | ORAL_TABLET | Freq: Every day | ORAL | 1 refills | Status: DC

## 2020-11-06 MED ORDER — SUBVENITE 200 MG PO TABS
200.0000 mg | ORAL_TABLET | Freq: Two times a day (BID) | ORAL | 1 refills | Status: DC
Start: 2020-11-06 — End: 2021-01-17
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
## 2021-01-03 ENCOUNTER — Other Ambulatory Visit: Payer: Self-pay

## 2021-01-03 ENCOUNTER — Encounter: Payer: Self-pay | Admitting: Psychiatry

## 2021-01-03 ENCOUNTER — Ambulatory Visit (INDEPENDENT_AMBULATORY_CARE_PROVIDER_SITE_OTHER): Payer: BC Managed Care – PPO | Admitting: Psychiatry

## 2021-01-03 DIAGNOSIS — F4310 Post-traumatic stress disorder, unspecified: Secondary | ICD-10-CM

## 2021-01-03 DIAGNOSIS — F331 Major depressive disorder, recurrent, moderate: Secondary | ICD-10-CM

## 2021-01-03 DIAGNOSIS — M797 Fibromyalgia: Secondary | ICD-10-CM

## 2021-01-03 DIAGNOSIS — F5105 Insomnia due to other mental disorder: Secondary | ICD-10-CM

## 2021-01-03 DIAGNOSIS — R7989 Other specified abnormal findings of blood chemistry: Secondary | ICD-10-CM

## 2021-01-03 DIAGNOSIS — F900 Attention-deficit hyperactivity disorder, predominantly inattentive type: Secondary | ICD-10-CM

## 2021-01-03 MED ORDER — ARIPIPRAZOLE 15 MG PO TABS: 15.0000 mg | ORAL_TABLET | Freq: Every day | ORAL | 1 refills | Status: DC

## 2021-01-03 MED ORDER — METHYLPHENIDATE HCL 20 MG PO TABS: ORAL_TABLET | ORAL | 0 refills | Status: DC
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
## 2021-01-13 ENCOUNTER — Other Ambulatory Visit: Payer: Self-pay | Admitting: Psychiatry

## 2021-01-13 DIAGNOSIS — F331 Major depressive disorder, recurrent, moderate: Secondary | ICD-10-CM

## 2021-01-13 DIAGNOSIS — F4310 Post-traumatic stress disorder, unspecified: Secondary | ICD-10-CM

## 2021-01-16 NOTE — Telephone Encounter (Signed)
Please review

## 2021-01-17 ENCOUNTER — Other Ambulatory Visit: Payer: Self-pay | Admitting: Psychiatry

## 2021-01-17 ENCOUNTER — Telehealth: Payer: Self-pay | Admitting: Psychiatry

## 2021-01-17 DIAGNOSIS — F331 Major depressive disorder, recurrent, moderate: Secondary | ICD-10-CM

## 2021-01-17 DIAGNOSIS — F4310 Post-traumatic stress disorder, unspecified: Secondary | ICD-10-CM

## 2021-01-17 MED ORDER — SUBVENITE 200 MG PO TABS: 200.0000 mg | ORAL_TABLET | Freq: Two times a day (BID) | ORAL | 1 refills | Status: DC

## 2021-01-17 NOTE — Telephone Encounter (Signed)
Pt called and needs a refill on her subvente 200 mg [facility] on [address]. This is the only place she can get the brand name of this drug.

## 2021-01-17 NOTE — Telephone Encounter (Signed)
Please send

## 2021-02-19 ENCOUNTER — Telehealth: Payer: Self-pay | Admitting: Psychiatry

## 2021-02-19 NOTE — Telephone Encounter (Signed)
Move her appt up with me since she recently became pregnant

## 2021-02-19 NOTE — Telephone Encounter (Signed)
She needs to avoid the stimulant methylphenidate during pregnancy.  She may have already stopped it as she was planning to take it just during tax season.  She also needs to avoid the lorazepam except in rare circumstances meaning less than 1 a month.  She can continue the Abilify.

## 2021-02-19 NOTE — Telephone Encounter (Signed)
Jennifer Ho called in today stating that she found she was pregnant. She would like to know if her current medications are safe to take. Pls CB with instructions. Ph: [phone]

## 2021-02-19 NOTE — Telephone Encounter (Signed)
Pt informed

## 2021-02-19 NOTE — Telephone Encounter (Signed)
Please review

## 2021-02-19 NOTE — Telephone Encounter (Signed)
Pt called to move apt up from 8/8. Nothing available. Put on canc list. Did CC want to work her in?

## 2021-02-22 ENCOUNTER — Ambulatory Visit: Payer: BC Managed Care – PPO | Admitting: Psychiatry

## 2021-02-23 ENCOUNTER — Other Ambulatory Visit: Payer: Self-pay | Admitting: Psychiatry

## 2021-02-23 MED ORDER — HYDROXYZINE HCL 10 MG PO TABS: 10.0000 mg | ORAL_TABLET | Freq: Three times a day (TID) | ORAL | 0 refills | Status: DC | PRN

## 2021-02-23 NOTE — Telephone Encounter (Signed)
Hydroxyzine is fine once in a while in pregnancy and I'll send RX.

## 2021-02-23 NOTE — Telephone Encounter (Signed)
I have already discussed with her in the past that she would need to stop the stimulant and lorazepam if she got pregnant.  Please call her to see if she needs to discuss any issues with me.  If she satisfied with the plan she does not need to come in to see me for an appointment at this time.

## 2021-02-23 NOTE — Telephone Encounter (Signed)
You did state in earlier message to move her appt up.I spoke to her and she said the only question she had was if she can be prescribed hydroxyzine while pregnant to replace the ativan.

## 2021-02-27 NOTE — Telephone Encounter (Signed)
Pt informed

## 2021-03-01 ENCOUNTER — Telehealth: Payer: Self-pay | Admitting: Psychiatry

## 2021-03-01 ENCOUNTER — Other Ambulatory Visit: Payer: Self-pay

## 2021-03-01 DIAGNOSIS — F331 Major depressive disorder, recurrent, moderate: Secondary | ICD-10-CM

## 2021-03-01 DIAGNOSIS — R7989 Other specified abnormal findings of blood chemistry: Secondary | ICD-10-CM

## 2021-03-01 DIAGNOSIS — F4310 Post-traumatic stress disorder, unspecified: Secondary | ICD-10-CM

## 2021-03-01 MED ORDER — ARIPIPRAZOLE 15 MG PO TABS: 15.0000 mg | ORAL_TABLET | Freq: Every day | ORAL | 0 refills | Status: DC

## 2021-03-01 NOTE — Telephone Encounter (Signed)
Rx sent 

## 2021-03-01 NOTE — Telephone Encounter (Signed)
Please send in RF for Abilify 15mg - she is requesting a 90 day supply to go to [facility] - #[id number], [place] - [address] AT [facility] [address]

## 2021-04-06 ENCOUNTER — Telehealth: Payer: Self-pay | Admitting: Psychiatry

## 2021-04-06 NOTE — Telephone Encounter (Signed)
Patient lm inquiring if she could increase the dosage of her Abilify and Lamictal. She stated she is pregnant with twins. Per her OBGYN her blood volume has increased so indirectly her medication seems less effective. Please advise. Patient is scheduled for an appointment on 8/4 and she is on the wait list. [phone].

## 2021-04-06 NOTE — Telephone Encounter (Signed)
Please review

## 2021-04-10 NOTE — Telephone Encounter (Signed)
Pt informed

## 2021-04-10 NOTE — Telephone Encounter (Signed)
Understand and agree with what the OB/GYN is saying in general.  She will be clearing the medication more quickly.  Because these are fat soluble medications rather than water-soluble medications there blood level will not have dropped in half but they will be reduced.  We should increase 1 thing at a time and the most helpful thing would be to increase the Abilify from 15 to 20 mg daily.  Thus the med that is most likely to be helpful for intrusive thoughts.  She can use up to 15 mg tablet she has been taking 1-1/2 daily but when she needs this to send in a 20 mg tablet just let us know.

## 2021-04-10 NOTE — Telephone Encounter (Signed)
Per her OBGYN her blood volume has increased so indirectly her medication seems less effective.I asked pt did she think her meds were not as affective and she stated yes she is having some intrusive thoughts and feels the doses should be increased.

## 2021-04-10 NOTE — Telephone Encounter (Signed)
Tell her congratulations on the pregnancy.  The message does not tell me what the problem is.  If she having more symptoms?  If so what are those symptoms.  We do not have to go up in the dose if she is not having more symptoms.

## 2021-04-25 NOTE — Telephone Encounter (Signed)
Pt called back in today with concerns regarding Abilify. States that she increased dosage a couple of weeks ago and it just isn't working. She would like to know if she could increase dosage again. Pls Rtc [phone].

## 2021-04-26 ENCOUNTER — Other Ambulatory Visit: Payer: Self-pay | Admitting: Psychiatry

## 2021-04-26 NOTE — Telephone Encounter (Signed)
I do not see a phone message or visit that pt had recent increase in dose of Abilify 15 mg, I do see a 90 day refill submitted end of June.    Sheralyn Boatman, can you call to get more information how long she has been taking 15 mg? I know she is currently pregnant. Pt has apt 8/04

## 2021-04-26 NOTE — Telephone Encounter (Signed)
She can increase Abilify to 30 mg.  It could take a couple of weeks to help but could work quickly too. Use up her current supply of Abilify 15m g tablets 2 daily.  If it helps then can send in Rx for 30 mg tablets

## 2021-04-26 NOTE — Telephone Encounter (Signed)
Pt informed

## 2021-04-26 NOTE — Telephone Encounter (Signed)
She recently increased to 1.5 tabs daily 2 weeks ago and she thinks it needs to be 2 daily because she is still having intrusive thoughts that is interfering with her sleep.She will have thoughts of her husband being in a crash,snakes in the toilet etc.

## 2021-05-10 ENCOUNTER — Ambulatory Visit: Payer: BC Managed Care – PPO | Admitting: Psychiatry

## 2021-05-11 ENCOUNTER — Encounter: Payer: Self-pay | Admitting: Psychiatry

## 2021-05-11 ENCOUNTER — Telehealth (INDEPENDENT_AMBULATORY_CARE_PROVIDER_SITE_OTHER): Payer: BC Managed Care – PPO | Admitting: Psychiatry

## 2021-05-11 DIAGNOSIS — M797 Fibromyalgia: Secondary | ICD-10-CM | POA: Diagnosis not present

## 2021-05-11 DIAGNOSIS — R7989 Other specified abnormal findings of blood chemistry: Secondary | ICD-10-CM | POA: Diagnosis not present

## 2021-05-11 DIAGNOSIS — F4310 Post-traumatic stress disorder, unspecified: Secondary | ICD-10-CM | POA: Diagnosis not present

## 2021-05-11 DIAGNOSIS — F900 Attention-deficit hyperactivity disorder, predominantly inattentive type: Secondary | ICD-10-CM

## 2021-05-11 DIAGNOSIS — F331 Major depressive disorder, recurrent, moderate: Secondary | ICD-10-CM

## 2021-05-11 DIAGNOSIS — F5105 Insomnia due to other mental disorder: Secondary | ICD-10-CM

## 2021-05-11 MED ORDER — ARIPIPRAZOLE 30 MG PO TABS: 30.0000 mg | ORAL_TABLET | Freq: Every day | ORAL | 1 refills | Status: DC

## 2021-05-11 MED ORDER — RISPERIDONE 1 MG PO TABS: 2.0000 mg | ORAL_TABLET | Freq: Every day | ORAL | 0 refills | Status: DC
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
## 2021-06-15 ENCOUNTER — Ambulatory Visit (INDEPENDENT_AMBULATORY_CARE_PROVIDER_SITE_OTHER): Payer: BC Managed Care – PPO | Admitting: Psychiatry

## 2021-06-15 ENCOUNTER — Encounter: Payer: Self-pay | Admitting: Psychiatry

## 2021-06-15 ENCOUNTER — Other Ambulatory Visit: Payer: Self-pay

## 2021-06-15 DIAGNOSIS — R7989 Other specified abnormal findings of blood chemistry: Secondary | ICD-10-CM

## 2021-06-15 DIAGNOSIS — F4310 Post-traumatic stress disorder, unspecified: Secondary | ICD-10-CM

## 2021-06-15 DIAGNOSIS — F331 Major depressive disorder, recurrent, moderate: Secondary | ICD-10-CM | POA: Diagnosis not present

## 2021-06-15 DIAGNOSIS — F5105 Insomnia due to other mental disorder: Secondary | ICD-10-CM | POA: Diagnosis not present

## 2021-06-15 DIAGNOSIS — F900 Attention-deficit hyperactivity disorder, predominantly inattentive type: Secondary | ICD-10-CM

## 2021-06-15 MED ORDER — RISPERIDONE 1 MG PO TABS: 2.0000 mg | ORAL_TABLET | Freq: Every day | ORAL | 0 refills | Status: DC

## 2021-06-15 MED ORDER — ARIPIPRAZOLE 30 MG PO TABS: 30.0000 mg | ORAL_TABLET | Freq: Every day | ORAL | 1 refills | Status: DC

## 2021-06-15 MED ORDER — SUBVENITE 200 MG PO TABS: 200.0000 mg | ORAL_TABLET | Freq: Two times a day (BID) | ORAL | 1 refills | Status: DC
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
## 2021-07-13 ENCOUNTER — Telehealth (INDEPENDENT_AMBULATORY_CARE_PROVIDER_SITE_OTHER): Payer: BC Managed Care – PPO | Admitting: Psychiatry

## 2021-07-13 ENCOUNTER — Encounter: Payer: Self-pay | Admitting: Psychiatry

## 2021-07-13 DIAGNOSIS — F331 Major depressive disorder, recurrent, moderate: Secondary | ICD-10-CM

## 2021-07-13 DIAGNOSIS — F900 Attention-deficit hyperactivity disorder, predominantly inattentive type: Secondary | ICD-10-CM

## 2021-07-13 DIAGNOSIS — F5105 Insomnia due to other mental disorder: Secondary | ICD-10-CM | POA: Diagnosis not present

## 2021-07-13 DIAGNOSIS — M797 Fibromyalgia: Secondary | ICD-10-CM

## 2021-07-13 DIAGNOSIS — R45851 Suicidal ideations: Secondary | ICD-10-CM

## 2021-07-13 DIAGNOSIS — F4310 Post-traumatic stress disorder, unspecified: Secondary | ICD-10-CM | POA: Diagnosis not present

## 2021-07-13 MED ORDER — LITHIUM CARBONATE ER 450 MG PO TBCR: 450.0000 mg | EXTENDED_RELEASE_TABLET | Freq: Every evening | ORAL | 1 refills | Status: DC
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
## 2021-08-21 ENCOUNTER — Encounter: Payer: Self-pay | Admitting: Psychiatry

## 2021-08-21 ENCOUNTER — Telehealth (INDEPENDENT_AMBULATORY_CARE_PROVIDER_SITE_OTHER): Payer: BC Managed Care – PPO | Admitting: Psychiatry

## 2021-08-21 DIAGNOSIS — R45851 Suicidal ideations: Secondary | ICD-10-CM

## 2021-08-21 DIAGNOSIS — F900 Attention-deficit hyperactivity disorder, predominantly inattentive type: Secondary | ICD-10-CM

## 2021-08-21 DIAGNOSIS — F5105 Insomnia due to other mental disorder: Secondary | ICD-10-CM

## 2021-08-21 DIAGNOSIS — F4310 Post-traumatic stress disorder, unspecified: Secondary | ICD-10-CM

## 2021-08-21 DIAGNOSIS — M797 Fibromyalgia: Secondary | ICD-10-CM

## 2021-08-21 DIAGNOSIS — F331 Major depressive disorder, recurrent, moderate: Secondary | ICD-10-CM | POA: Diagnosis not present
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
## 2021-09-10 ENCOUNTER — Telehealth: Payer: Self-pay | Admitting: Psychiatry

## 2021-09-10 NOTE — Telephone Encounter (Signed)
Pt called and said that she would lie to go back on lithium. She is pregnant and doesn't know if that will work for  since she is pregnant. Please give her a call at [phone]

## 2021-09-10 NOTE — Telephone Encounter (Signed)
Called patient. She is taking lithium, what she wants to know is if she can breastfeed while taking lithium. She is due 1/5 with twins, but is already 4.5 cm dilated. She said she had discussed this issue with you before when she was in preterm labor and was told you needed to reread literature.

## 2021-09-11 NOTE — Telephone Encounter (Signed)
She cannot breastfeed and take lithium.  She will need to bottlefeed or stop the lithium.

## 2021-09-12 NOTE — Telephone Encounter (Signed)
Patient notified

## 2021-09-13 NOTE — Telephone Encounter (Signed)
Reviewed

## 2021-09-24 ENCOUNTER — Encounter: Payer: Self-pay | Admitting: Psychiatry

## 2021-09-24 ENCOUNTER — Telehealth (INDEPENDENT_AMBULATORY_CARE_PROVIDER_SITE_OTHER): Payer: BC Managed Care – PPO | Admitting: Psychiatry

## 2021-09-24 DIAGNOSIS — F331 Major depressive disorder, recurrent, moderate: Secondary | ICD-10-CM | POA: Diagnosis not present

## 2021-09-24 DIAGNOSIS — F4310 Post-traumatic stress disorder, unspecified: Secondary | ICD-10-CM | POA: Diagnosis not present

## 2021-09-24 DIAGNOSIS — F5105 Insomnia due to other mental disorder: Secondary | ICD-10-CM

## 2021-09-24 DIAGNOSIS — M797 Fibromyalgia: Secondary | ICD-10-CM

## 2021-09-24 DIAGNOSIS — F900 Attention-deficit hyperactivity disorder, predominantly inattentive type: Secondary | ICD-10-CM
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
## 2021-10-08 ENCOUNTER — Other Ambulatory Visit: Payer: Self-pay | Admitting: Psychiatry

## 2021-10-08 DIAGNOSIS — F331 Major depressive disorder, recurrent, moderate: Secondary | ICD-10-CM

## 2021-10-08 DIAGNOSIS — R45851 Suicidal ideations: Secondary | ICD-10-CM

## 2021-10-09 ENCOUNTER — Telehealth: Payer: Self-pay | Admitting: Psychiatry

## 2021-10-09 NOTE — Telephone Encounter (Signed)
Pt called and wants to restart lithium. Can we call to help her restart it?

## 2021-10-09 NOTE — Telephone Encounter (Signed)
Please review

## 2021-10-10 NOTE — Telephone Encounter (Signed)
She just delivered twins.  To answer the question I need more information.   What symptoms are causing her to want to restart lithium?  Is she having SI?  Is she breastfeeding?

## 2021-10-11 NOTE — Telephone Encounter (Signed)
LVM to rtc 

## 2021-10-12 NOTE — Telephone Encounter (Signed)
Pt is not breastfeeding.She stated she is having anxiety and crying spells.

## 2021-10-17 NOTE — Telephone Encounter (Signed)
Please apologize, I overlooked this note as I did not see the reply.  Tell her to restart lithium 150 mg 1 at night for a week and if she does not feel better in a week increase to 2 of the 150 mg capsules that is a low dose still.  Let us know if she does not feel better soon

## 2021-10-18 ENCOUNTER — Other Ambulatory Visit: Payer: Self-pay

## 2021-10-18 ENCOUNTER — Telehealth: Payer: Self-pay | Admitting: Psychiatry

## 2021-10-18 MED ORDER — LITHIUM CARBONATE 150 MG PO CAPS: 150.0000 mg | ORAL_CAPSULE | Freq: Every day | ORAL | 0 refills | Status: DC

## 2021-10-18 NOTE — Telephone Encounter (Signed)
LVM to rtc 

## 2021-10-18 NOTE — Telephone Encounter (Signed)
Pt RTC  from [facility]. [phone]

## 2021-10-18 NOTE — Telephone Encounter (Signed)
Still unable to reach pt

## 2021-10-18 NOTE — Telephone Encounter (Signed)
PT INFORMED

## 2021-10-18 NOTE — Telephone Encounter (Signed)
Spoke to pt

## 2021-10-18 NOTE — Telephone Encounter (Signed)
Attempted to reach pt, no answer

## 2021-10-18 NOTE — Telephone Encounter (Signed)
Pt LVM that she was returning a call from [place] about a message from Dr. Jennelle Human.  Pls call her back. [phone]

## 2021-11-01 ENCOUNTER — Telehealth (INDEPENDENT_AMBULATORY_CARE_PROVIDER_SITE_OTHER): Payer: BC Managed Care – PPO | Admitting: Psychiatry

## 2021-11-01 ENCOUNTER — Encounter: Payer: Self-pay | Admitting: Psychiatry

## 2021-11-01 DIAGNOSIS — F331 Major depressive disorder, recurrent, moderate: Secondary | ICD-10-CM

## 2021-11-01 DIAGNOSIS — F5105 Insomnia due to other mental disorder: Secondary | ICD-10-CM | POA: Diagnosis not present

## 2021-11-01 DIAGNOSIS — F4310 Post-traumatic stress disorder, unspecified: Secondary | ICD-10-CM

## 2021-11-01 DIAGNOSIS — F900 Attention-deficit hyperactivity disorder, predominantly inattentive type: Secondary | ICD-10-CM | POA: Diagnosis not present

## 2021-11-01 DIAGNOSIS — M797 Fibromyalgia: Secondary | ICD-10-CM

## 2021-11-01 MED ORDER — LITHIUM CARBONATE ER 450 MG PO TBCR: 450.0000 mg | EXTENDED_RELEASE_TABLET | Freq: Two times a day (BID) | ORAL | 0 refills | Status: DC

## 2021-11-01 MED ORDER — ARIPIPRAZOLE 20 MG PO TABS: 20.0000 mg | ORAL_TABLET | Freq: Every day | ORAL | 0 refills | Status: DC

## 2021-11-01 MED ORDER — TEMAZEPAM 15 MG PO CAPS: 15.0000 mg | ORAL_CAPSULE | Freq: Every evening | ORAL | 0 refills | Status: DC | PRN

## 2021-11-01 NOTE — Progress Notes (Signed)
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 MD, DFAPA   Please see After Visit Summary for patient specific instructions.  Future Appointments  Date Time Provider Department Center  11/29/2021  2:00 PM Cottle, Steva Ready., MD [facility] None    No orders of the defined types were placed in this encounter.    -------------------------------

## 2021-11-02 ENCOUNTER — Other Ambulatory Visit: Payer: Self-pay | Admitting: Psychiatry

## 2021-11-02 ENCOUNTER — Telehealth: Payer: Self-pay | Admitting: Psychiatry

## 2021-11-02 DIAGNOSIS — F331 Major depressive disorder, recurrent, moderate: Secondary | ICD-10-CM

## 2021-11-02 DIAGNOSIS — F4310 Post-traumatic stress disorder, unspecified: Secondary | ICD-10-CM

## 2021-11-02 MED ORDER — LITHIUM CARBONATE ER 450 MG PO TBCR: 450.0000 mg | EXTENDED_RELEASE_TABLET | Freq: Every day | ORAL | 0 refills | Status: DC

## 2021-11-02 NOTE — Telephone Encounter (Signed)
Please inform pharmacy lithium CR 450 mg tablet, 1 daily, #90. I'll correct in chart.

## 2021-11-02 NOTE — Telephone Encounter (Signed)
Spoke with Raynelle Fanning at pharmacy and provided script information needed.

## 2021-11-02 NOTE — Telephone Encounter (Signed)
Please see phone message from the pharmacy in regards to lithium dose. I was not able to ascertain the correct lithium dose from yesterday's visit.

## 2021-11-02 NOTE — Telephone Encounter (Signed)
Please call Raynelle Fanning back at [phone] at [facility] re: the Lithium carbonate 450mg  dosing. Confusion on whether it should be two a day ( as written) or 1 per day ( as pt thought). Also only sent in for #90 so this is further confusing.

## 2021-11-02 NOTE — Progress Notes (Signed)
Dose correction lithium CR 450mg  tablet, 1 daily, #90

## 2021-11-12 DIAGNOSIS — F3341 Major depressive disorder, recurrent, in partial remission: Secondary | ICD-10-CM | POA: Diagnosis not present

## 2021-11-21 DIAGNOSIS — F3341 Major depressive disorder, recurrent, in partial remission: Secondary | ICD-10-CM | POA: Diagnosis not present

## 2021-11-21 DIAGNOSIS — G4733 Obstructive sleep apnea (adult) (pediatric): Secondary | ICD-10-CM | POA: Diagnosis not present

## 2021-11-28 DIAGNOSIS — F3341 Major depressive disorder, recurrent, in partial remission: Secondary | ICD-10-CM | POA: Diagnosis not present

## 2021-11-29 ENCOUNTER — Telehealth: Payer: BC Managed Care – PPO | Admitting: Psychiatry

## 2021-12-03 DIAGNOSIS — F3341 Major depressive disorder, recurrent, in partial remission: Secondary | ICD-10-CM | POA: Diagnosis not present

## 2021-12-05 DIAGNOSIS — E063 Autoimmune thyroiditis: Secondary | ICD-10-CM | POA: Diagnosis not present

## 2021-12-05 DIAGNOSIS — E038 Other specified hypothyroidism: Secondary | ICD-10-CM | POA: Diagnosis not present

## 2021-12-06 DIAGNOSIS — R197 Diarrhea, unspecified: Secondary | ICD-10-CM | POA: Diagnosis not present

## 2021-12-06 DIAGNOSIS — N921 Excessive and frequent menstruation with irregular cycle: Secondary | ICD-10-CM | POA: Diagnosis not present

## 2021-12-06 DIAGNOSIS — N9089 Other specified noninflammatory disorders of vulva and perineum: Secondary | ICD-10-CM | POA: Diagnosis not present

## 2021-12-06 DIAGNOSIS — D509 Iron deficiency anemia, unspecified: Secondary | ICD-10-CM | POA: Diagnosis not present

## 2021-12-19 DIAGNOSIS — G4733 Obstructive sleep apnea (adult) (pediatric): Secondary | ICD-10-CM | POA: Diagnosis not present

## 2021-12-24 DIAGNOSIS — E038 Other specified hypothyroidism: Secondary | ICD-10-CM | POA: Diagnosis not present

## 2021-12-24 DIAGNOSIS — O1493 Unspecified pre-eclampsia, third trimester: Secondary | ICD-10-CM | POA: Diagnosis not present

## 2021-12-24 DIAGNOSIS — E063 Autoimmune thyroiditis: Secondary | ICD-10-CM | POA: Diagnosis not present

## 2021-12-24 DIAGNOSIS — D649 Anemia, unspecified: Secondary | ICD-10-CM | POA: Diagnosis not present

## 2021-12-24 DIAGNOSIS — D508 Other iron deficiency anemias: Secondary | ICD-10-CM | POA: Diagnosis not present

## 2021-12-25 DIAGNOSIS — Z8759 Personal history of other complications of pregnancy, childbirth and the puerperium: Secondary | ICD-10-CM | POA: Diagnosis not present

## 2021-12-25 DIAGNOSIS — R197 Diarrhea, unspecified: Secondary | ICD-10-CM | POA: Diagnosis not present

## 2021-12-27 ENCOUNTER — Other Ambulatory Visit: Payer: Self-pay

## 2021-12-27 ENCOUNTER — Ambulatory Visit: Payer: BC Managed Care – PPO | Admitting: Psychiatry

## 2021-12-27 ENCOUNTER — Telehealth: Payer: Self-pay | Admitting: Psychiatry

## 2021-12-27 MED ORDER — ARIPIPRAZOLE 15 MG PO TABS: 15.0000 mg | ORAL_TABLET | Freq: Every day | ORAL | 0 refills | Status: DC

## 2021-12-27 NOTE — Telephone Encounter (Signed)
Pt informed and rx sent 

## 2021-12-27 NOTE — Telephone Encounter (Signed)
Spoke with patient regarding her Abilify. States that she has been tapering off from 30mg  to 20mg  over an eight week period. She would like to know if she can now taper down to 15mg  for the next eight weeks. Pls call [phone] ?

## 2021-12-27 NOTE — Telephone Encounter (Signed)
Pt had an appt this morning.Please advise

## 2021-12-27 NOTE — Telephone Encounter (Signed)
Yes, if she feels fairly stable with out mood swings or SI then she can reduce to 15 mg Abilify now.  If she needs a prescription then send one in for her ?

## 2021-12-28 DIAGNOSIS — R197 Diarrhea, unspecified: Secondary | ICD-10-CM | POA: Diagnosis not present

## 2022-01-07 DIAGNOSIS — F3341 Major depressive disorder, recurrent, in partial remission: Secondary | ICD-10-CM | POA: Diagnosis not present

## 2022-01-14 DIAGNOSIS — F3341 Major depressive disorder, recurrent, in partial remission: Secondary | ICD-10-CM | POA: Diagnosis not present

## 2022-01-19 DIAGNOSIS — G4733 Obstructive sleep apnea (adult) (pediatric): Secondary | ICD-10-CM | POA: Diagnosis not present

## 2022-01-22 DIAGNOSIS — K625 Hemorrhage of anus and rectum: Secondary | ICD-10-CM | POA: Diagnosis not present

## 2022-01-22 DIAGNOSIS — Z8632 Personal history of gestational diabetes: Secondary | ICD-10-CM | POA: Diagnosis not present

## 2022-01-22 DIAGNOSIS — R197 Diarrhea, unspecified: Secondary | ICD-10-CM | POA: Diagnosis not present

## 2022-01-24 DIAGNOSIS — F3341 Major depressive disorder, recurrent, in partial remission: Secondary | ICD-10-CM | POA: Diagnosis not present

## 2022-02-04 ENCOUNTER — Encounter: Payer: Self-pay | Admitting: Psychiatry

## 2022-02-04 ENCOUNTER — Ambulatory Visit (INDEPENDENT_AMBULATORY_CARE_PROVIDER_SITE_OTHER): Payer: BC Managed Care – PPO | Admitting: Psychiatry

## 2022-02-04 DIAGNOSIS — F331 Major depressive disorder, recurrent, moderate: Secondary | ICD-10-CM | POA: Diagnosis not present

## 2022-02-04 DIAGNOSIS — F5105 Insomnia due to other mental disorder: Secondary | ICD-10-CM | POA: Diagnosis not present

## 2022-02-04 DIAGNOSIS — F4310 Post-traumatic stress disorder, unspecified: Secondary | ICD-10-CM | POA: Diagnosis not present

## 2022-02-04 DIAGNOSIS — M797 Fibromyalgia: Secondary | ICD-10-CM

## 2022-02-04 DIAGNOSIS — F900 Attention-deficit hyperactivity disorder, predominantly inattentive type: Secondary | ICD-10-CM | POA: Diagnosis not present

## 2022-02-04 NOTE — Progress Notes (Signed)
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 med sensitive. Disc may retry SSRI bc failed them while a teenagerer If not effective call next week.  High ADHD screeening score: No stimulant in pregnancy  Follow-up  4-6 weeks  Meredith Staggers MD, DFAPA   Please see After Visit Summary for patient specific instructions.  No future appointments.   No orders of the defined types were placed in this encounter.    -------------------------------

## 2022-02-07 DIAGNOSIS — F3341 Major depressive disorder, recurrent, in partial remission: Secondary | ICD-10-CM | POA: Diagnosis not present

## 2022-02-10 ENCOUNTER — Other Ambulatory Visit: Payer: Self-pay | Admitting: Psychiatry

## 2022-02-10 DIAGNOSIS — F4310 Post-traumatic stress disorder, unspecified: Secondary | ICD-10-CM

## 2022-02-10 DIAGNOSIS — F331 Major depressive disorder, recurrent, moderate: Secondary | ICD-10-CM

## 2022-02-12 DIAGNOSIS — G600 Hereditary motor and sensory neuropathy: Secondary | ICD-10-CM | POA: Diagnosis not present

## 2022-02-12 DIAGNOSIS — M21372 Foot drop, left foot: Secondary | ICD-10-CM | POA: Diagnosis not present

## 2022-02-12 DIAGNOSIS — O24419 Gestational diabetes mellitus in pregnancy, unspecified control: Secondary | ICD-10-CM | POA: Diagnosis not present

## 2022-02-12 DIAGNOSIS — R197 Diarrhea, unspecified: Secondary | ICD-10-CM | POA: Diagnosis not present

## 2022-02-12 DIAGNOSIS — R29898 Other symptoms and signs involving the musculoskeletal system: Secondary | ICD-10-CM | POA: Diagnosis not present

## 2022-02-12 DIAGNOSIS — F3341 Major depressive disorder, recurrent, in partial remission: Secondary | ICD-10-CM | POA: Diagnosis not present

## 2022-02-12 DIAGNOSIS — M21371 Foot drop, right foot: Secondary | ICD-10-CM | POA: Diagnosis not present

## 2022-02-18 DIAGNOSIS — K625 Hemorrhage of anus and rectum: Secondary | ICD-10-CM | POA: Diagnosis not present

## 2022-02-18 DIAGNOSIS — R197 Diarrhea, unspecified: Secondary | ICD-10-CM | POA: Diagnosis not present

## 2022-02-18 DIAGNOSIS — K648 Other hemorrhoids: Secondary | ICD-10-CM | POA: Diagnosis not present

## 2022-02-18 DIAGNOSIS — D124 Benign neoplasm of descending colon: Secondary | ICD-10-CM | POA: Diagnosis not present

## 2022-02-18 DIAGNOSIS — D123 Benign neoplasm of transverse colon: Secondary | ICD-10-CM | POA: Diagnosis not present

## 2022-02-18 DIAGNOSIS — G4733 Obstructive sleep apnea (adult) (pediatric): Secondary | ICD-10-CM | POA: Diagnosis not present

## 2022-02-20 ENCOUNTER — Telehealth: Payer: Self-pay | Admitting: Psychiatry

## 2022-02-20 DIAGNOSIS — F331 Major depressive disorder, recurrent, moderate: Secondary | ICD-10-CM

## 2022-02-20 DIAGNOSIS — F4310 Post-traumatic stress disorder, unspecified: Secondary | ICD-10-CM

## 2022-02-20 NOTE — Telephone Encounter (Signed)
Pt called at 10:15 am and said that the increase on the lithium for 2 weeks didn't work well for her. Also didn't help with abilify 15 mg. So she wants to go back to lithium 450  mg and needs abilify 20 mg. She will needs refills on those meds. Her pharmacy is [facility] on [address] in [place] ?

## 2022-02-21 MED ORDER — ARIPIPRAZOLE 20 MG PO TABS: 20.0000 mg | ORAL_TABLET | Freq: Every day | ORAL | 0 refills | Status: DC

## 2022-02-21 MED ORDER — LITHIUM CARBONATE ER 450 MG PO TBCR: 450.0000 mg | EXTENDED_RELEASE_TABLET | Freq: Every day | ORAL | 0 refills | Status: DC

## 2022-02-21 NOTE — Telephone Encounter (Signed)
Ok. Can you send in 90 day RX each of those.? Lithium can be CR 450 mg 1 daily.

## 2022-02-21 NOTE — Telephone Encounter (Signed)
See message from patient. Called her and she said the change in dosing did help with her intrusive thoughts but her anxiety has increased. She is wanting to go back to 20 mg of Abilify and 450 mg of lithium.

## 2022-02-21 NOTE — Telephone Encounter (Signed)
LVM to RC 

## 2022-03-01 DIAGNOSIS — F3341 Major depressive disorder, recurrent, in partial remission: Secondary | ICD-10-CM | POA: Diagnosis not present

## 2022-03-06 DIAGNOSIS — F3341 Major depressive disorder, recurrent, in partial remission: Secondary | ICD-10-CM | POA: Diagnosis not present

## 2022-03-12 DIAGNOSIS — F3341 Major depressive disorder, recurrent, in partial remission: Secondary | ICD-10-CM | POA: Diagnosis not present

## 2022-03-13 DIAGNOSIS — Z8632 Personal history of gestational diabetes: Secondary | ICD-10-CM | POA: Diagnosis not present

## 2022-03-13 DIAGNOSIS — E038 Other specified hypothyroidism: Secondary | ICD-10-CM | POA: Diagnosis not present

## 2022-03-13 DIAGNOSIS — E063 Autoimmune thyroiditis: Secondary | ICD-10-CM | POA: Diagnosis not present

## 2022-03-21 DIAGNOSIS — G4733 Obstructive sleep apnea (adult) (pediatric): Secondary | ICD-10-CM | POA: Diagnosis not present

## 2022-03-26 DIAGNOSIS — F3341 Major depressive disorder, recurrent, in partial remission: Secondary | ICD-10-CM | POA: Diagnosis not present

## 2022-03-29 ENCOUNTER — Other Ambulatory Visit: Payer: Self-pay | Admitting: Psychiatry

## 2022-03-29 DIAGNOSIS — F331 Major depressive disorder, recurrent, moderate: Secondary | ICD-10-CM

## 2022-03-29 DIAGNOSIS — F4310 Post-traumatic stress disorder, unspecified: Secondary | ICD-10-CM

## 2022-04-02 ENCOUNTER — Encounter: Payer: Self-pay | Admitting: Psychiatry

## 2022-04-02 ENCOUNTER — Ambulatory Visit (INDEPENDENT_AMBULATORY_CARE_PROVIDER_SITE_OTHER): Payer: BC Managed Care – PPO | Admitting: Psychiatry

## 2022-04-02 DIAGNOSIS — F4310 Post-traumatic stress disorder, unspecified: Secondary | ICD-10-CM | POA: Diagnosis not present

## 2022-04-02 DIAGNOSIS — R45851 Suicidal ideations: Secondary | ICD-10-CM

## 2022-04-02 DIAGNOSIS — F900 Attention-deficit hyperactivity disorder, predominantly inattentive type: Secondary | ICD-10-CM | POA: Diagnosis not present

## 2022-04-02 DIAGNOSIS — M797 Fibromyalgia: Secondary | ICD-10-CM

## 2022-04-02 DIAGNOSIS — F331 Major depressive disorder, recurrent, moderate: Secondary | ICD-10-CM

## 2022-04-02 DIAGNOSIS — F5105 Insomnia due to other mental disorder: Secondary | ICD-10-CM

## 2022-04-02 NOTE — Progress Notes (Signed)
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 decreased appetite.  Instructed patient to contact office if experiencing any significant tolerability issues. Ritalin 10 BID then 20 BID  Follow-up  4-6 weeks  Meredith Staggers MD, DFAPA   Please see After Visit Summary for patient specific instructions.  No future appointments.   No orders of the defined types were placed in this encounter.    -------------------------------

## 2022-04-04 DIAGNOSIS — R300 Dysuria: Secondary | ICD-10-CM | POA: Diagnosis not present

## 2022-04-04 DIAGNOSIS — N3000 Acute cystitis without hematuria: Secondary | ICD-10-CM | POA: Diagnosis not present

## 2022-04-10 DIAGNOSIS — F3341 Major depressive disorder, recurrent, in partial remission: Secondary | ICD-10-CM | POA: Diagnosis not present

## 2022-04-17 DIAGNOSIS — M25552 Pain in left hip: Secondary | ICD-10-CM | POA: Diagnosis not present

## 2022-04-17 DIAGNOSIS — M5416 Radiculopathy, lumbar region: Secondary | ICD-10-CM | POA: Diagnosis not present

## 2022-04-17 DIAGNOSIS — M9903 Segmental and somatic dysfunction of lumbar region: Secondary | ICD-10-CM | POA: Diagnosis not present

## 2022-04-17 DIAGNOSIS — M62830 Muscle spasm of back: Secondary | ICD-10-CM | POA: Diagnosis not present

## 2022-04-18 DIAGNOSIS — M5416 Radiculopathy, lumbar region: Secondary | ICD-10-CM | POA: Diagnosis not present

## 2022-04-18 DIAGNOSIS — M62830 Muscle spasm of back: Secondary | ICD-10-CM | POA: Diagnosis not present

## 2022-04-18 DIAGNOSIS — M25552 Pain in left hip: Secondary | ICD-10-CM | POA: Diagnosis not present

## 2022-04-18 DIAGNOSIS — M9903 Segmental and somatic dysfunction of lumbar region: Secondary | ICD-10-CM | POA: Diagnosis not present

## 2022-04-20 DIAGNOSIS — G4733 Obstructive sleep apnea (adult) (pediatric): Secondary | ICD-10-CM | POA: Diagnosis not present

## 2022-04-23 DIAGNOSIS — M25552 Pain in left hip: Secondary | ICD-10-CM | POA: Diagnosis not present

## 2022-04-23 DIAGNOSIS — R197 Diarrhea, unspecified: Secondary | ICD-10-CM | POA: Diagnosis not present

## 2022-04-23 DIAGNOSIS — F3341 Major depressive disorder, recurrent, in partial remission: Secondary | ICD-10-CM | POA: Diagnosis not present

## 2022-04-23 DIAGNOSIS — M5416 Radiculopathy, lumbar region: Secondary | ICD-10-CM | POA: Diagnosis not present

## 2022-04-23 DIAGNOSIS — Z0000 Encounter for general adult medical examination without abnormal findings: Secondary | ICD-10-CM | POA: Diagnosis not present

## 2022-04-23 DIAGNOSIS — M62830 Muscle spasm of back: Secondary | ICD-10-CM | POA: Diagnosis not present

## 2022-04-23 DIAGNOSIS — M9903 Segmental and somatic dysfunction of lumbar region: Secondary | ICD-10-CM | POA: Diagnosis not present

## 2022-04-25 DIAGNOSIS — M5416 Radiculopathy, lumbar region: Secondary | ICD-10-CM | POA: Diagnosis not present

## 2022-04-25 DIAGNOSIS — M25552 Pain in left hip: Secondary | ICD-10-CM | POA: Diagnosis not present

## 2022-04-25 DIAGNOSIS — M62830 Muscle spasm of back: Secondary | ICD-10-CM | POA: Diagnosis not present

## 2022-04-25 DIAGNOSIS — M9903 Segmental and somatic dysfunction of lumbar region: Secondary | ICD-10-CM | POA: Diagnosis not present

## 2022-04-30 DIAGNOSIS — M9903 Segmental and somatic dysfunction of lumbar region: Secondary | ICD-10-CM | POA: Diagnosis not present

## 2022-04-30 DIAGNOSIS — L720 Epidermal cyst: Secondary | ICD-10-CM | POA: Diagnosis not present

## 2022-04-30 DIAGNOSIS — M62830 Muscle spasm of back: Secondary | ICD-10-CM | POA: Diagnosis not present

## 2022-04-30 DIAGNOSIS — M25552 Pain in left hip: Secondary | ICD-10-CM | POA: Diagnosis not present

## 2022-04-30 DIAGNOSIS — M5416 Radiculopathy, lumbar region: Secondary | ICD-10-CM | POA: Diagnosis not present

## 2022-05-02 ENCOUNTER — Telehealth: Payer: Self-pay | Admitting: Psychiatry

## 2022-05-02 DIAGNOSIS — M25552 Pain in left hip: Secondary | ICD-10-CM | POA: Diagnosis not present

## 2022-05-02 DIAGNOSIS — M62830 Muscle spasm of back: Secondary | ICD-10-CM | POA: Diagnosis not present

## 2022-05-02 DIAGNOSIS — M9903 Segmental and somatic dysfunction of lumbar region: Secondary | ICD-10-CM | POA: Diagnosis not present

## 2022-05-02 DIAGNOSIS — M5416 Radiculopathy, lumbar region: Secondary | ICD-10-CM | POA: Diagnosis not present

## 2022-05-02 NOTE — Telephone Encounter (Signed)
The max daily dose is 60 mg daily.  I can write it for 20 mg 2 in the AM and 1 at noon, or she could split tablets and take 30 mg BID

## 2022-05-02 NOTE — Telephone Encounter (Signed)
Called patient and she is asking for 40 mg methylphenidate. She said she can tell her "body is metabolizing it differently." She said she could take it as 40 mg or 20 mg depending on how she felt. She typically is not taking it on the weekend.  From your June note: Ritalin 10 BID then 20 BID.  She has an appt with you in September. She said she didn't know if you would up the dose now or would wait until her appt.

## 2022-05-02 NOTE — Telephone Encounter (Signed)
Pt LVM @ 11:20a.  She said that her script for Methylphenidate expired.  She wanted to know if Dr Jennelle Human will send in a script for 40mg  so she can take 20mg  on some days and 40mg  when she feels she needs to.  Started this message before I realized she didn't say where she wanted the med sent to.  I called back but had to leave voice mail for her to call back with that info.  Then send it on to clinical.  Next appt 9/19

## 2022-05-02 NOTE — Telephone Encounter (Signed)
She would like the medicine sent to [facility] [address].  I advised pt that she has an appt on 9/19.  She wasn't sure if Dr Jennelle Human would prescribe the higher dose before her appt.

## 2022-05-03 NOTE — Telephone Encounter (Signed)
Patient is wanting 20 mg TID. She wasn't sure insurance would cover TID and was thinking she could get a 40 and split it and then a 20. Told her we couldn't fill like that. We can do a PA if insurance will not cover TID. Will pend Rx on Monday.

## 2022-05-03 NOTE — Telephone Encounter (Signed)
LVM to RC 

## 2022-05-06 ENCOUNTER — Other Ambulatory Visit: Payer: Self-pay

## 2022-05-06 MED ORDER — METHYLPHENIDATE HCL 20 MG PO TABS: 20.0000 mg | ORAL_TABLET | Freq: Three times a day (TID) | ORAL | 0 refills | Status: DC

## 2022-05-06 NOTE — Telephone Encounter (Signed)
Pended.

## 2022-05-07 DIAGNOSIS — L720 Epidermal cyst: Secondary | ICD-10-CM | POA: Diagnosis not present

## 2022-05-07 DIAGNOSIS — L308 Other specified dermatitis: Secondary | ICD-10-CM | POA: Diagnosis not present

## 2022-05-07 DIAGNOSIS — M9903 Segmental and somatic dysfunction of lumbar region: Secondary | ICD-10-CM | POA: Diagnosis not present

## 2022-05-07 DIAGNOSIS — M25552 Pain in left hip: Secondary | ICD-10-CM | POA: Diagnosis not present

## 2022-05-07 DIAGNOSIS — M62830 Muscle spasm of back: Secondary | ICD-10-CM | POA: Diagnosis not present

## 2022-05-07 DIAGNOSIS — M5416 Radiculopathy, lumbar region: Secondary | ICD-10-CM | POA: Diagnosis not present

## 2022-05-07 DIAGNOSIS — L929 Granulomatous disorder of the skin and subcutaneous tissue, unspecified: Secondary | ICD-10-CM | POA: Diagnosis not present

## 2022-05-14 DIAGNOSIS — M9903 Segmental and somatic dysfunction of lumbar region: Secondary | ICD-10-CM | POA: Diagnosis not present

## 2022-05-14 DIAGNOSIS — M5416 Radiculopathy, lumbar region: Secondary | ICD-10-CM | POA: Diagnosis not present

## 2022-05-14 DIAGNOSIS — M25552 Pain in left hip: Secondary | ICD-10-CM | POA: Diagnosis not present

## 2022-05-14 DIAGNOSIS — M62830 Muscle spasm of back: Secondary | ICD-10-CM | POA: Diagnosis not present

## 2022-05-16 DIAGNOSIS — M5416 Radiculopathy, lumbar region: Secondary | ICD-10-CM | POA: Diagnosis not present

## 2022-05-16 DIAGNOSIS — M62830 Muscle spasm of back: Secondary | ICD-10-CM | POA: Diagnosis not present

## 2022-05-16 DIAGNOSIS — M25552 Pain in left hip: Secondary | ICD-10-CM | POA: Diagnosis not present

## 2022-05-16 DIAGNOSIS — M9903 Segmental and somatic dysfunction of lumbar region: Secondary | ICD-10-CM | POA: Diagnosis not present

## 2022-05-21 DIAGNOSIS — G4733 Obstructive sleep apnea (adult) (pediatric): Secondary | ICD-10-CM | POA: Diagnosis not present

## 2022-05-21 DIAGNOSIS — F3341 Major depressive disorder, recurrent, in partial remission: Secondary | ICD-10-CM | POA: Diagnosis not present

## 2022-05-25 ENCOUNTER — Other Ambulatory Visit: Payer: Self-pay | Admitting: Psychiatry

## 2022-05-28 DIAGNOSIS — M9903 Segmental and somatic dysfunction of lumbar region: Secondary | ICD-10-CM | POA: Diagnosis not present

## 2022-05-28 DIAGNOSIS — M25552 Pain in left hip: Secondary | ICD-10-CM | POA: Diagnosis not present

## 2022-05-28 DIAGNOSIS — M62830 Muscle spasm of back: Secondary | ICD-10-CM | POA: Diagnosis not present

## 2022-05-28 DIAGNOSIS — M5416 Radiculopathy, lumbar region: Secondary | ICD-10-CM | POA: Diagnosis not present

## 2022-06-21 DIAGNOSIS — G4733 Obstructive sleep apnea (adult) (pediatric): Secondary | ICD-10-CM | POA: Diagnosis not present

## 2022-06-25 ENCOUNTER — Encounter: Payer: Self-pay | Admitting: Psychiatry

## 2022-06-25 ENCOUNTER — Ambulatory Visit (INDEPENDENT_AMBULATORY_CARE_PROVIDER_SITE_OTHER): Payer: BC Managed Care – PPO | Admitting: Psychiatry

## 2022-06-25 DIAGNOSIS — F900 Attention-deficit hyperactivity disorder, predominantly inattentive type: Secondary | ICD-10-CM

## 2022-06-25 DIAGNOSIS — F331 Major depressive disorder, recurrent, moderate: Secondary | ICD-10-CM

## 2022-06-25 DIAGNOSIS — F5105 Insomnia due to other mental disorder: Secondary | ICD-10-CM | POA: Diagnosis not present

## 2022-06-25 DIAGNOSIS — F4310 Post-traumatic stress disorder, unspecified: Secondary | ICD-10-CM | POA: Diagnosis not present

## 2022-06-25 MED ORDER — METHYLPHENIDATE HCL 20 MG PO TABS: 20.0000 mg | ORAL_TABLET | Freq: Three times a day (TID) | ORAL | 0 refills | Status: DC

## 2022-06-25 MED ORDER — METHYLPHENIDATE HCL 20 MG PO TABS
20.0000 mg | ORAL_TABLET | Freq: Three times a day (TID) | ORAL | 0 refills | Status: DC
Start: 2022-07-23 — End: 2022-08-15

## 2022-06-25 MED ORDER — ARIPIPRAZOLE 30 MG PO TABS: 30.0000 mg | ORAL_TABLET | Freq: Every day | ORAL | 0 refills | Status: DC

## 2022-06-25 NOTE — Progress Notes (Signed)
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 mouth 3 (three) times daily with meals. -     methylphenidate (RITALIN) 20 MG tablet; Take 1 tablet (20 mg total) by mouth 3 (three) times daily with meals. -     methylphenidate (RITALIN) 20 MG tablet; Take 1 tablet (20 mg total) by mouth 3 (three) times daily with meals.  Insomnia due to mental condition   Greater than 50% of 30 min video face to face time with patient was spent on counseling and coordination of care. We discussed : She had been having problems with mood lability and intrusive irrational thoughts and images with no apparent trigger are a lot less.  Discussed the recommendation to increase Abilify from 15 to 20 mg daily because of worsening with the decrease.  She prefers to try to increase lithium and instead and try to keep the Abilify dose lower.  We discussed the pros and cons of each.  Delivered twins with support but prolonged recovery expected Dt birth trauma Don't expect postpartum.  Extensive discussion of each  med in pregnancy.  No expected birth defect risks and preg going well.    increase Abilify to 30 mg daily  for TR anxiety and dep Continue Lithium 450 mg 1 tablets daily Continue subvenite 200 mg daily.  High ADHD screeening score: Discussed potential benefits, risks, and side effects of stimulants with patient to include increased heart rate, palpitations, insomnia, increased anxiety, increased irritability, or decreased appetite.  Instructed patient to contact office if experiencing any significant tolerability issues. Ritalin   Follow-up 8 weeks  Meredith Staggers MD, DFAPA   Please see After Visit Summary for patient specific instructions.  No future appointments.   No orders of the defined types were placed in this  encounter.    -------------------------------

## 2022-07-01 DIAGNOSIS — N390 Urinary tract infection, site not specified: Secondary | ICD-10-CM | POA: Diagnosis not present

## 2022-07-03 ENCOUNTER — Telehealth: Payer: Self-pay | Admitting: Psychiatry

## 2022-07-03 DIAGNOSIS — Z0000 Encounter for general adult medical examination without abnormal findings: Secondary | ICD-10-CM | POA: Diagnosis not present

## 2022-07-03 DIAGNOSIS — R42 Dizziness and giddiness: Secondary | ICD-10-CM | POA: Diagnosis not present

## 2022-07-03 NOTE — Telephone Encounter (Signed)
Called patient and she states she is having lightheadedness, hypotension, and nausea from the increased dose of Abilify (30 mg). She tolerated the 20 mg well. She said she took 30 mg while pregnant, but that she had hypertension during pregnancy/preeclampsia so didn't experience the SE currently experiencing. She was seen at [facility] today and her BP was 114/67, pulse 78.   Reviewed meds: Abilify 30 mg Hydroxyzine 10 mg - rarely takes Lithium 450  Ritalin 20  Subvenite Rx is for  Temazepam - not taking  She said the Abilify has been very helpful in controlling her anxiety and she would like to stay on it. She asks if she needs to go down in dose if you would increase her lithium dose.

## 2022-07-03 NOTE — Telephone Encounter (Signed)
Patient lvm stating her Abilify was increased on last week. She is experiencing light headiness, low Bp, and nausea due to the increase. She has visited the walk in clinic twice this week, and haven't been able to work. Please advise.  Next appointment 09/17/22  Contact information [phone]

## 2022-07-03 NOTE — Telephone Encounter (Signed)
She can reduce Abilify back to 20 mg daily and increase lithium Cr 450 mg tablets from 1 daily to 1 and 1/2 tablets daily.  Tablets say do not cut but it is ok to cut them.

## 2022-07-04 NOTE — Telephone Encounter (Signed)
Patient notified of recommendations. 

## 2022-07-08 ENCOUNTER — Other Ambulatory Visit: Payer: Self-pay | Admitting: Psychiatry

## 2022-07-09 NOTE — Telephone Encounter (Signed)
Pt called back again about a refill of 20mg .  I re-read her the message from Dr Clovis Pu and she said she understood.

## 2022-07-16 DIAGNOSIS — Z124 Encounter for screening for malignant neoplasm of cervix: Secondary | ICD-10-CM | POA: Diagnosis not present

## 2022-07-16 DIAGNOSIS — Z6835 Body mass index (BMI) 35.0-35.9, adult: Secondary | ICD-10-CM | POA: Diagnosis not present

## 2022-07-16 DIAGNOSIS — Z1151 Encounter for screening for human papillomavirus (HPV): Secondary | ICD-10-CM | POA: Diagnosis not present

## 2022-07-16 DIAGNOSIS — Z01419 Encounter for gynecological examination (general) (routine) without abnormal findings: Secondary | ICD-10-CM | POA: Diagnosis not present

## 2022-07-21 DIAGNOSIS — G4733 Obstructive sleep apnea (adult) (pediatric): Secondary | ICD-10-CM | POA: Diagnosis not present

## 2022-07-22 DIAGNOSIS — F3341 Major depressive disorder, recurrent, in partial remission: Secondary | ICD-10-CM | POA: Diagnosis not present

## 2022-07-23 ENCOUNTER — Ambulatory Visit: Payer: BC Managed Care – PPO | Admitting: Physician Assistant

## 2022-07-24 ENCOUNTER — Other Ambulatory Visit: Payer: Self-pay | Admitting: Psychiatry

## 2022-07-24 DIAGNOSIS — F4310 Post-traumatic stress disorder, unspecified: Secondary | ICD-10-CM

## 2022-07-24 DIAGNOSIS — F331 Major depressive disorder, recurrent, moderate: Secondary | ICD-10-CM

## 2022-07-30 DIAGNOSIS — F418 Other specified anxiety disorders: Secondary | ICD-10-CM | POA: Diagnosis not present

## 2022-07-30 DIAGNOSIS — R5383 Other fatigue: Secondary | ICD-10-CM | POA: Diagnosis not present

## 2022-07-30 DIAGNOSIS — G4733 Obstructive sleep apnea (adult) (pediatric): Secondary | ICD-10-CM | POA: Diagnosis not present

## 2022-07-30 DIAGNOSIS — K136 Irritative hyperplasia of oral mucosa: Secondary | ICD-10-CM | POA: Diagnosis not present

## 2022-07-30 DIAGNOSIS — R102 Pelvic and perineal pain: Secondary | ICD-10-CM | POA: Diagnosis not present

## 2022-07-30 DIAGNOSIS — R718 Other abnormality of red blood cells: Secondary | ICD-10-CM | POA: Diagnosis not present

## 2022-07-30 DIAGNOSIS — R638 Other symptoms and signs concerning food and fluid intake: Secondary | ICD-10-CM | POA: Diagnosis not present

## 2022-07-30 DIAGNOSIS — R7303 Prediabetes: Secondary | ICD-10-CM | POA: Diagnosis not present

## 2022-07-30 DIAGNOSIS — K529 Noninfective gastroenteritis and colitis, unspecified: Secondary | ICD-10-CM | POA: Diagnosis not present

## 2022-08-13 ENCOUNTER — Ambulatory Visit: Payer: BC Managed Care – PPO | Admitting: Family Medicine

## 2022-08-13 DIAGNOSIS — F909 Attention-deficit hyperactivity disorder, unspecified type: Secondary | ICD-10-CM | POA: Diagnosis not present

## 2022-08-13 DIAGNOSIS — D509 Iron deficiency anemia, unspecified: Secondary | ICD-10-CM | POA: Diagnosis not present

## 2022-08-13 DIAGNOSIS — F39 Unspecified mood [affective] disorder: Secondary | ICD-10-CM | POA: Diagnosis not present

## 2022-08-13 DIAGNOSIS — E039 Hypothyroidism, unspecified: Secondary | ICD-10-CM | POA: Diagnosis not present

## 2022-08-15 ENCOUNTER — Ambulatory Visit (INDEPENDENT_AMBULATORY_CARE_PROVIDER_SITE_OTHER): Payer: BC Managed Care – PPO | Admitting: Family Medicine

## 2022-08-15 ENCOUNTER — Encounter: Payer: Self-pay | Admitting: Family Medicine

## 2022-08-15 VITALS — BP 122/80 | HR 86 | Temp 98.4°F | Ht 66.0 in | Wt 219.2 lb

## 2022-08-15 DIAGNOSIS — R197 Diarrhea, unspecified: Secondary | ICD-10-CM | POA: Diagnosis not present

## 2022-08-15 DIAGNOSIS — F3160 Bipolar disorder, current episode mixed, unspecified: Secondary | ICD-10-CM | POA: Diagnosis not present

## 2022-08-15 MED ORDER — CYCLOBENZAPRINE HCL 10 MG PO TABS: 10.0000 mg | ORAL_TABLET | Freq: Every day | ORAL | 3 refills | Status: DC

## 2022-08-15 NOTE — Patient Instructions (Signed)
Welcome to [facility]! It was a pleasure meeting you today.  As discussed, Please schedule a 12 month follow up visit today.  SIBO.    PLEASE NOTE:  If you had any LAB tests please let us know if you have not heard back within a few days. You may see your results on MyChart before we have a chance to review them but we will give you a call once they are reviewed by [place]. If we ordered any REFERRALS today, please let us know if you have not heard from their office within the next week.  Let us know through MyChart if you are needing REFILLS, or have your pharmacy send [place] the request. You can also use MyChart to communicate with me or any office staff.  Please try these tips to maintain a healthy lifestyle:  Eat most of your calories during the day when you are active. Eliminate processed foods including packaged sweets (pies, cakes, cookies), reduce intake of potatoes, white bread, white pasta, and white rice. Look for whole grain options, oat flour or almond flour.  Each meal should contain half fruits/vegetables, one quarter protein, and one quarter carbs (no bigger than a computer mouse).  Cut down on sweet beverages. This includes juice, soda, and sweet tea. Also watch fruit intake, though this is a healthier sweet option, it still contains natural sugar! Limit to 3 servings daily.  Drink at least 1 glass of water with each meal and aim for at least 8 glasses per day  Exercise at least 150 minutes every week.
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
## 2022-08-21 DIAGNOSIS — G4733 Obstructive sleep apnea (adult) (pediatric): Secondary | ICD-10-CM | POA: Diagnosis not present

## 2022-09-06 ENCOUNTER — Telehealth: Payer: Self-pay | Admitting: Psychiatry

## 2022-09-06 ENCOUNTER — Telehealth: Payer: BC Managed Care – PPO | Admitting: Emergency Medicine

## 2022-09-06 ENCOUNTER — Other Ambulatory Visit: Payer: Self-pay

## 2022-09-06 DIAGNOSIS — R110 Nausea: Secondary | ICD-10-CM | POA: Diagnosis not present

## 2022-09-06 MED ORDER — PROMETHAZINE HCL 12.5 MG PO TABS: 12.5000 mg | ORAL_TABLET | Freq: Three times a day (TID) | ORAL | 0 refills | Status: DC | PRN

## 2022-09-06 MED ORDER — LITHIUM CARBONATE ER 450 MG PO TBCR: 900.0000 mg | EXTENDED_RELEASE_TABLET | Freq: Every day | ORAL | 0 refills | Status: DC

## 2022-09-06 NOTE — Patient Instructions (Signed)
  Jennifer Ho, thank you for joining Jennifer Parsons, NP for today's virtual visit.  While this provider is not your primary care provider (PCP), if your PCP is located in our provider database this encounter information will be shared with them immediately following your visit.   A [facility] MyChart account gives you access to today's visit and all your visits, tests, and labs performed at [facility] " click here if you don't have a [facility] MyChart account or go to [email or web address]  Consent: (Patient) Jennifer Ho provided verbal consent for this virtual visit at the beginning of the encounter.  Current Medications:  Current Outpatient Medications:    promethazine (PHENERGAN) 12.5 MG tablet, Take 1 tablet (12.5 mg total) by mouth every 8 (eight) hours as needed for nausea or vomiting., Disp: 20 tablet, Rfl: 0   cyclobenzaprine (FLEXERIL) 10 MG tablet, Take 1 tablet (10 mg total) by mouth at bedtime., Disp: 60 tablet, Rfl: 3   EPINEPHrine 0.3 mg/0.3 mL IJ SOAJ injection, INJ 1 PEN IM PRF SEVERE ALLERGIC REACTIONS, Disp: , Rfl:    lamoTRIgine (LAMICTAL) 200 MG tablet, Take 200 mg by mouth daily., Disp: , Rfl:    levothyroxine (SYNTHROID) 112 MCG tablet, Take 112 mcg by mouth daily., Disp: , Rfl:    LITHIUM PO, Take 900 mg by mouth., Disp: , Rfl:    Loperamide HCl (IMODIUM PO), Take by mouth., Disp: , Rfl:    methylphenidate (RITALIN) 20 MG tablet, , Disp: , Rfl:    Probiotic Product (PROBIOTIC PO), Take by mouth., Disp: , Rfl:    Medications ordered in this encounter:  Meds ordered this encounter  Medications   promethazine (PHENERGAN) 12.5 MG tablet    Sig: Take 1 tablet (12.5 mg total) by mouth every 8 (eight) hours as needed for nausea or vomiting.    Dispense:  20 tablet    Refill:  0     *If you need refills on other medications prior to your next appointment, please contact your pharmacy*  Follow-Up: Call back or seek an in-person  evaluation if the symptoms worsen or if the condition fails to improve as anticipated.  [facility] [phone]  Other Instructions It is okay to vomit a little bit.  Your body is trying to purge what ever illness is causing the symptoms.  We want to avoid uncontrolled frequent vomiting.  You may use the nausea medicine if needed if you have persistent bouts of vomiting.   If you have been instructed to have an in-person evaluation today at a local Urgent Care facility, please use the link below. It will take you to a list of all of our available [facility], including address, phone number and hours of operation. Please do not delay care.  [facility]  If you or a family member do not have a primary care provider, use the link below to schedule a visit and establish care. When you choose a [facility] primary care physician or advanced practice provider, you gain a long-term partner in health. Find a Primary Care Provider  Learn more about [facility]'s in-office and virtual care options: [facility] - Get Care Now
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
## 2022-09-06 NOTE — Telephone Encounter (Signed)
Pt called requesting Rx for new dose Lithium 2-450 mg = 900 mg total one time daily. [facility]. Apt 12/12. Pt out due to doubling up on previous dose.

## 2022-09-06 NOTE — Telephone Encounter (Signed)
Pt is no longer on Abilify and has been taking 2 lithium 450 mg instead of 1.5 tabs.She is feeling much better on 900 mg.Ok to send that dose?

## 2022-09-06 NOTE — Telephone Encounter (Signed)
LVM to rtc.The note stated she should be taking 1.5 tabs and not 2

## 2022-09-06 NOTE — Telephone Encounter (Signed)
Rx sent 

## 2022-09-06 NOTE — Telephone Encounter (Signed)
Yes.  Send lithium CR 450 mg tablets, 2 daily, #180, no relfills

## 2022-09-10 DIAGNOSIS — K136 Irritative hyperplasia of oral mucosa: Secondary | ICD-10-CM | POA: Diagnosis not present

## 2022-09-17 ENCOUNTER — Encounter: Payer: Self-pay | Admitting: Psychiatry

## 2022-09-17 ENCOUNTER — Ambulatory Visit (INDEPENDENT_AMBULATORY_CARE_PROVIDER_SITE_OTHER): Payer: BC Managed Care – PPO | Admitting: Psychiatry

## 2022-09-17 DIAGNOSIS — F5105 Insomnia due to other mental disorder: Secondary | ICD-10-CM | POA: Diagnosis not present

## 2022-09-17 DIAGNOSIS — F331 Major depressive disorder, recurrent, moderate: Secondary | ICD-10-CM | POA: Diagnosis not present

## 2022-09-17 DIAGNOSIS — F4310 Post-traumatic stress disorder, unspecified: Secondary | ICD-10-CM | POA: Diagnosis not present

## 2022-09-17 DIAGNOSIS — M797 Fibromyalgia: Secondary | ICD-10-CM

## 2022-09-17 DIAGNOSIS — E063 Autoimmune thyroiditis: Secondary | ICD-10-CM | POA: Diagnosis not present

## 2022-09-17 DIAGNOSIS — E038 Other specified hypothyroidism: Secondary | ICD-10-CM | POA: Diagnosis not present

## 2022-09-17 DIAGNOSIS — F900 Attention-deficit hyperactivity disorder, predominantly inattentive type: Secondary | ICD-10-CM | POA: Diagnosis not present

## 2022-09-17 DIAGNOSIS — Z8632 Personal history of gestational diabetes: Secondary | ICD-10-CM | POA: Diagnosis not present

## 2022-09-17 MED ORDER — RISPERIDONE 0.5 MG PO TABS: 0.5000 mg | ORAL_TABLET | Freq: Two times a day (BID) | ORAL | 1 refills | Status: DC

## 2022-09-17 NOTE — Progress Notes (Signed)
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        Speech: Speech normal. Speech is not slurred.        Behavior: Behavior normal. Behavior is cooperative.        Thought Content: Thought content normal. Thought content is not paranoid or delusional. Thought content does not include homicidal or suicidal ideation. Thought content does not include suicidal plan.        Cognition and Memory: Cognition and memory normal.        Judgment: Judgment normal.     Comments: Insight intact More moody off atypical suicidal thoughts resolved      Lab Review:     Component Value Date/Time   NA 139 07/02/2015 0150   K 4.0 07/02/2015 0150   CL 103 07/02/2015 0150   CO2 26 07/02/2015 0150   GLUCOSE 112 (H) 07/02/2015 0150   BUN 11 07/02/2015 0150   CREATININE 0.68 07/02/2015 0150   CALCIUM 9.4 07/02/2015 0150   PROT 6.9 07/02/2015 0150   ALBUMIN 4.0 07/02/2015 0150   AST 30 07/02/2015 0150   ALT 26 07/02/2015 0150   ALKPHOS 105 07/02/2015 0150   BILITOT 0.2 (L) 07/02/2015 0150   GFRNONAA >60 07/02/2015 0150   GFRAA >60 07/02/2015 0150        Component Value Date/Time   WBC 10.3 07/02/2015 0150   RBC 4.97 07/02/2015 0150   HGB 14.7 07/02/2015 0150   HCT 42.8 07/02/2015 0150   PLT 269 07/02/2015 0150   MCV 86.1 07/02/2015 0150   MCH 29.6 07/02/2015 0150   MCHC 34.3 07/02/2015 0150   RDW 14.0 07/02/2015 0150   LYMPHSABS 3.6 07/02/2015 0150   MONOABS 0.7 07/02/2015 0150   EOSABS 0.2 07/02/2015 0150   BASOSABS 0.1 07/02/2015 0150    No results found for: "POCLITH", "LITHIUM"   No results found for: "PHENYTOIN", "PHENOBARB", "VALPROATE", "CBMZ"   07/25/20 Answered questions about  ADD consider modafinil at next visit.  ADD questionaire done today with inattention score 19, hyperactivity score 26   Component 10/16/20 08/07/20  Prolactin 49.5 97.2 High  Ref Range & Units 2 mo ago 07/03/22 Comments  Sodium 135 - 146 MMOL/L 140   Potassium 3.5 - 5.3 MMOL/L 4.6 NO VISIBLE HEMOLYSIS  Chloride 98 - 110 MMOL/L 105   CO2 21 - 31 MMOL/L 28   BUN 8 - 24 MG/DL 11   Glucose 70 - 99 MG/DL 720 High    Creatinine 0.60 - 1.20 MG/DL 9.47   Calcium 8.5 - 09.6 MG/DL 9.8     .res Assessment: Plan:    Necie was seen today for follow-up, depression, post-traumatic stress disorder and anxiety.  Diagnoses and all orders for this visit:  Moderate recurrent major depression (HCC) -     risperiDONE (RISPERDAL) 0.5 MG tablet; Take 1 tablet (0.5 mg total) by mouth 2 (two) times daily.  PTSD (post-traumatic stress disorder) -     risperiDONE (RISPERDAL) 0.5 MG tablet; Take 1 tablet (0.5 mg total) by mouth 2 (two) times daily.  Attention deficit hyperactivity disorder (ADHD), predominantly inattentive type  Insomnia due to mental condition  Fibromyalgia   Greater than 50% of 30 min video face to face time with patient was spent on counseling and coordination of care. We discussed : She had been having problems with mood lability and intrusive irrational thoughts and images with no apparent trigger are a lot less.  Discussed  the recommendation to increase Abilify from 15 to 20 mg daily because of worsening with the decrease.  She prefers to try to increase lithium and instead and try to keep the Abilify dose lower.  We discussed the pros and cons of each.  Delivered twins with support but prolonged recovery expected Dt birth trauma Don't expect postpartum.  Option Vraylar.  Risperidone 0.5 mg BID in place of Abilify for moodiness. Continue Lithium 450 mg 2 tablets daily Continue subvenite 200 mg daily.  High ADHD screeening score: Discussed potential benefits, risks, and side effects of stimulants with patient to include increased heart rate, palpitations, insomnia, increased anxiety, increased irritability, or decreased appetite.  Instructed patient to contact office if experiencing any significant tolerability issues. Ritalin prn  Follow-up 8 weeks  Meredith Staggers MD, DFAPA   Please see After Visit Summary for patient specific instructions.  No future appointments.   No orders of the defined types were placed in this encounter.    -------------------------------

## 2022-09-24 DIAGNOSIS — M797 Fibromyalgia: Secondary | ICD-10-CM | POA: Diagnosis not present

## 2022-09-24 DIAGNOSIS — E669 Obesity, unspecified: Secondary | ICD-10-CM | POA: Diagnosis not present

## 2022-09-24 DIAGNOSIS — Z6834 Body mass index (BMI) 34.0-34.9, adult: Secondary | ICD-10-CM | POA: Diagnosis not present

## 2022-09-24 DIAGNOSIS — F331 Major depressive disorder, recurrent, moderate: Secondary | ICD-10-CM | POA: Diagnosis not present

## 2022-09-25 ENCOUNTER — Other Ambulatory Visit (HOSPITAL_COMMUNITY): Payer: Self-pay

## 2022-09-25 MED ORDER — ZEPBOUND 2.5 MG/0.5ML ~~LOC~~ SOAJ
2.5000 mg | SUBCUTANEOUS | 1 refills | Status: AC
  Filled 2022-09-25: qty 2, 28d supply, fill #0
  Filled 2023-04-04: qty 2, 28d supply, fill #1

## 2022-09-26 ENCOUNTER — Other Ambulatory Visit (HOSPITAL_COMMUNITY): Payer: Self-pay

## 2022-09-26 ENCOUNTER — Other Ambulatory Visit (HOSPITAL_BASED_OUTPATIENT_CLINIC_OR_DEPARTMENT_OTHER): Payer: Self-pay

## 2022-09-27 ENCOUNTER — Other Ambulatory Visit (HOSPITAL_BASED_OUTPATIENT_CLINIC_OR_DEPARTMENT_OTHER): Payer: Self-pay

## 2022-10-15 DIAGNOSIS — R030 Elevated blood-pressure reading, without diagnosis of hypertension: Secondary | ICD-10-CM | POA: Diagnosis not present

## 2022-10-15 DIAGNOSIS — R110 Nausea: Secondary | ICD-10-CM | POA: Diagnosis not present

## 2022-10-15 DIAGNOSIS — R42 Dizziness and giddiness: Secondary | ICD-10-CM | POA: Diagnosis not present

## 2022-10-22 DIAGNOSIS — F3341 Major depressive disorder, recurrent, in partial remission: Secondary | ICD-10-CM | POA: Diagnosis not present

## 2022-10-30 DIAGNOSIS — F3341 Major depressive disorder, recurrent, in partial remission: Secondary | ICD-10-CM | POA: Diagnosis not present

## 2022-11-04 DIAGNOSIS — F3341 Major depressive disorder, recurrent, in partial remission: Secondary | ICD-10-CM | POA: Diagnosis not present

## 2022-11-07 ENCOUNTER — Other Ambulatory Visit (HOSPITAL_COMMUNITY): Payer: Self-pay

## 2022-11-07 DIAGNOSIS — E038 Other specified hypothyroidism: Secondary | ICD-10-CM | POA: Diagnosis not present

## 2022-11-07 DIAGNOSIS — R21 Rash and other nonspecific skin eruption: Secondary | ICD-10-CM | POA: Diagnosis not present

## 2022-11-07 DIAGNOSIS — E282 Polycystic ovarian syndrome: Secondary | ICD-10-CM | POA: Diagnosis not present

## 2022-11-07 MED ORDER — ZEPBOUND 5 MG/0.5ML ~~LOC~~ SOAJ
5.0000 mg | SUBCUTANEOUS | 0 refills | Status: DC
  Filled 2022-11-07 – 2023-01-13 (×2): qty 6, 84d supply, fill #0

## 2022-11-07 MED ORDER — ZEPBOUND 2.5 MG/0.5ML ~~LOC~~ SOAJ
2.5000 mg | SUBCUTANEOUS | 0 refills | Status: AC
  Filled 2022-11-07: qty 6, 84d supply, fill #0

## 2022-11-07 MED ORDER — NYSTATIN 100000 UNIT/GM EX OINT
1.0000 | TOPICAL_OINTMENT | Freq: Two times a day (BID) | CUTANEOUS | 0 refills | Status: AC | PRN
  Filled 2022-11-07: qty 60, 30d supply, fill #0

## 2022-11-07 MED ORDER — TRIAMCINOLONE ACETONIDE 0.1 % EX OINT
1.0000 | TOPICAL_OINTMENT | Freq: Two times a day (BID) | CUTANEOUS | 0 refills | Status: AC | PRN
  Filled 2022-11-07: qty 80, 30d supply, fill #0

## 2022-11-16 DIAGNOSIS — R1031 Right lower quadrant pain: Secondary | ICD-10-CM | POA: Diagnosis not present

## 2022-11-16 DIAGNOSIS — Z3202 Encounter for pregnancy test, result negative: Secondary | ICD-10-CM | POA: Diagnosis not present

## 2022-11-19 ENCOUNTER — Other Ambulatory Visit: Payer: Self-pay | Admitting: Psychiatry

## 2022-11-19 DIAGNOSIS — F4310 Post-traumatic stress disorder, unspecified: Secondary | ICD-10-CM

## 2022-11-19 DIAGNOSIS — F331 Major depressive disorder, recurrent, moderate: Secondary | ICD-10-CM

## 2022-11-20 DIAGNOSIS — F3341 Major depressive disorder, recurrent, in partial remission: Secondary | ICD-10-CM | POA: Diagnosis not present

## 2022-11-22 ENCOUNTER — Telehealth (HOSPITAL_COMMUNITY): Payer: Self-pay | Admitting: Psychiatry

## 2022-11-22 ENCOUNTER — Telehealth (HOSPITAL_COMMUNITY): Payer: Self-pay | Admitting: Licensed Clinical Social Worker

## 2022-11-22 NOTE — Telephone Encounter (Signed)
D:  Pt phoned inquiring about [place].  A:  Oriented pt.  [employer] [employer] Coordinator asked pt all the preliminary questions.  Pt states she has been seeing Dr. Clovis Pu for fifteen years.  Scored 17 on PHQ9,  Based upon all patient's answers, she is a candidate for [place].  Will schedule pt a consultation appt with Dr. Fatima Sanger.  Inform [place] team.  R:  Pt receptive.

## 2022-11-25 ENCOUNTER — Telehealth (HOSPITAL_COMMUNITY): Payer: Self-pay | Admitting: Psychiatry

## 2022-11-25 NOTE — Telephone Encounter (Signed)
D:  Placed call to inform her that her [facility] consultation with Dr. Fatima Sanger will be on 11-28-22 @ 4pm (encouraged pt to arrive 10-15 minutes early d/t paperwork); also informed pt to bring her insurance card.  Provided pt with the address.  A:  Inform [place] team.  R:  Pt receptive.

## 2022-11-26 ENCOUNTER — Ambulatory Visit: Payer: BC Managed Care – PPO | Admitting: Psychiatry

## 2022-11-26 ENCOUNTER — Telehealth (HOSPITAL_COMMUNITY): Payer: Self-pay | Admitting: Licensed Clinical Social Worker

## 2022-11-26 ENCOUNTER — Encounter: Payer: Self-pay | Admitting: Psychiatry

## 2022-11-26 DIAGNOSIS — F5105 Insomnia due to other mental disorder: Secondary | ICD-10-CM

## 2022-11-26 DIAGNOSIS — F339 Major depressive disorder, recurrent, unspecified: Secondary | ICD-10-CM | POA: Diagnosis not present

## 2022-11-26 DIAGNOSIS — F900 Attention-deficit hyperactivity disorder, predominantly inattentive type: Secondary | ICD-10-CM

## 2022-11-26 DIAGNOSIS — F4310 Post-traumatic stress disorder, unspecified: Secondary | ICD-10-CM | POA: Diagnosis not present

## 2022-11-26 DIAGNOSIS — R45851 Suicidal ideations: Secondary | ICD-10-CM

## 2022-11-26 DIAGNOSIS — M797 Fibromyalgia: Secondary | ICD-10-CM
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
## 2022-11-27 DIAGNOSIS — F3341 Major depressive disorder, recurrent, in partial remission: Secondary | ICD-10-CM | POA: Diagnosis not present

## 2022-11-28 ENCOUNTER — Ambulatory Visit (HOSPITAL_BASED_OUTPATIENT_CLINIC_OR_DEPARTMENT_OTHER): Payer: BC Managed Care – PPO | Admitting: Student in an Organized Health Care Education/Training Program

## 2022-11-28 DIAGNOSIS — F339 Major depressive disorder, recurrent, unspecified: Secondary | ICD-10-CM

## 2022-11-28 DIAGNOSIS — F4310 Post-traumatic stress disorder, unspecified: Secondary | ICD-10-CM | POA: Diagnosis not present

## 2022-11-28 DIAGNOSIS — F900 Attention-deficit hyperactivity disorder, predominantly inattentive type: Secondary | ICD-10-CM
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
## 2022-11-29 ENCOUNTER — Encounter (HOSPITAL_COMMUNITY): Payer: Self-pay | Admitting: Student in an Organized Health Care Education/Training Program

## 2022-12-02 ENCOUNTER — Telehealth (HOSPITAL_COMMUNITY): Payer: Self-pay | Admitting: Psychiatry

## 2022-12-02 NOTE — Telephone Encounter (Signed)
D:  The Interim [place] Coordinator placed call to re-orient pt and to inquire if she had verified her insurance benefits for [place] yet?  "I thought you would be getting all of that whenever you called my insurance company?"  Coordinator explained to pt she would be calling a different phone number for pre-certification/authorization not the benefits number.  Strongly reiterated to pt, she would need to verify her own benefits for [place] before coordinator could move forward, just so she (pt) wouldn't have any surprises.  Pt stated she would reach out to [employer] today.  A:  Provided pt with support.  Encouraged pt to call the coordinator once she calls [place].  Inform [place] treatment team.  R:  Pt receptive.

## 2022-12-03 DIAGNOSIS — F3341 Major depressive disorder, recurrent, in partial remission: Secondary | ICD-10-CM | POA: Diagnosis not present

## 2022-12-05 ENCOUNTER — Telehealth (HOSPITAL_COMMUNITY): Payer: Self-pay | Admitting: Psychiatry

## 2022-12-05 ENCOUNTER — Other Ambulatory Visit: Payer: Self-pay | Admitting: Psychiatry

## 2022-12-09 DIAGNOSIS — F32A Depression, unspecified: Secondary | ICD-10-CM | POA: Diagnosis not present

## 2022-12-09 DIAGNOSIS — F419 Anxiety disorder, unspecified: Secondary | ICD-10-CM | POA: Diagnosis not present

## 2022-12-09 DIAGNOSIS — F418 Other specified anxiety disorders: Secondary | ICD-10-CM | POA: Diagnosis not present

## 2022-12-11 DIAGNOSIS — F3341 Major depressive disorder, recurrent, in partial remission: Secondary | ICD-10-CM | POA: Diagnosis not present

## 2022-12-18 ENCOUNTER — Other Ambulatory Visit: Payer: Self-pay | Admitting: Family Medicine

## 2022-12-18 DIAGNOSIS — G4733 Obstructive sleep apnea (adult) (pediatric): Secondary | ICD-10-CM | POA: Diagnosis not present

## 2022-12-18 DIAGNOSIS — R519 Headache, unspecified: Secondary | ICD-10-CM

## 2022-12-18 DIAGNOSIS — F3341 Major depressive disorder, recurrent, in partial remission: Secondary | ICD-10-CM | POA: Diagnosis not present

## 2022-12-18 DIAGNOSIS — R7303 Prediabetes: Secondary | ICD-10-CM | POA: Diagnosis not present

## 2022-12-19 DIAGNOSIS — F332 Major depressive disorder, recurrent severe without psychotic features: Secondary | ICD-10-CM | POA: Diagnosis not present

## 2022-12-22 ENCOUNTER — Other Ambulatory Visit: Payer: Self-pay | Admitting: Psychiatry

## 2022-12-22 DIAGNOSIS — F4310 Post-traumatic stress disorder, unspecified: Secondary | ICD-10-CM

## 2022-12-22 DIAGNOSIS — F331 Major depressive disorder, recurrent, moderate: Secondary | ICD-10-CM

## 2022-12-24 ENCOUNTER — Ambulatory Visit
Admission: RE | Admit: 2022-12-24 | Discharge: 2022-12-24 | Disposition: A | Discharge status: Home / Self Care | Payer: BC Managed Care – PPO | Source: Ambulatory Visit | Attending: Family Medicine | Admitting: Family Medicine

## 2022-12-24 DIAGNOSIS — R519 Headache, unspecified: Secondary | ICD-10-CM

## 2022-12-24 MED ORDER — IOPAMIDOL (ISOVUE-300) INJECTION 61%
75.0000 mL | Freq: Once | INTRAVENOUS | Status: AC | PRN
  Administered 2022-12-24: 75 mL via INTRAVENOUS

## 2022-12-30 DIAGNOSIS — F3341 Major depressive disorder, recurrent, in partial remission: Secondary | ICD-10-CM | POA: Diagnosis not present

## 2022-12-31 DIAGNOSIS — E063 Autoimmune thyroiditis: Secondary | ICD-10-CM | POA: Diagnosis not present

## 2022-12-31 DIAGNOSIS — E038 Other specified hypothyroidism: Secondary | ICD-10-CM | POA: Diagnosis not present

## 2022-12-31 DIAGNOSIS — Z8632 Personal history of gestational diabetes: Secondary | ICD-10-CM | POA: Diagnosis not present

## 2023-01-03 ENCOUNTER — Encounter (HOSPITAL_BASED_OUTPATIENT_CLINIC_OR_DEPARTMENT_OTHER): Payer: Self-pay | Admitting: Emergency Medicine

## 2023-01-03 ENCOUNTER — Other Ambulatory Visit: Payer: Self-pay

## 2023-01-03 ENCOUNTER — Emergency Department (HOSPITAL_BASED_OUTPATIENT_CLINIC_OR_DEPARTMENT_OTHER): Payer: BC Managed Care – PPO

## 2023-01-03 ENCOUNTER — Other Ambulatory Visit (HOSPITAL_BASED_OUTPATIENT_CLINIC_OR_DEPARTMENT_OTHER): Payer: Self-pay

## 2023-01-03 ENCOUNTER — Emergency Department (HOSPITAL_BASED_OUTPATIENT_CLINIC_OR_DEPARTMENT_OTHER)
Admission: EM | Admit: 2023-01-03 | Discharge: 2023-01-03 | Disposition: A | Discharge status: Home / Self Care | Payer: BC Managed Care – PPO | Attending: Emergency Medicine | Admitting: Emergency Medicine

## 2023-01-03 DIAGNOSIS — E039 Hypothyroidism, unspecified: Secondary | ICD-10-CM | POA: Diagnosis not present

## 2023-01-03 DIAGNOSIS — Z79899 Other long term (current) drug therapy: Secondary | ICD-10-CM | POA: Insufficient documentation

## 2023-01-03 DIAGNOSIS — R1031 Right lower quadrant pain: Secondary | ICD-10-CM

## 2023-01-03 DIAGNOSIS — N83201 Unspecified ovarian cyst, right side: Secondary | ICD-10-CM | POA: Insufficient documentation

## 2023-01-03 DIAGNOSIS — R1903 Right lower quadrant abdominal swelling, mass and lump: Secondary | ICD-10-CM | POA: Diagnosis not present

## 2023-01-03 DIAGNOSIS — R109 Unspecified abdominal pain: Secondary | ICD-10-CM | POA: Diagnosis not present

## 2023-01-03 LAB — CBC WITH DIFFERENTIAL/PLATELET
Abs Immature Granulocytes: 0.05 10*3/uL (ref 0.00–0.07)
Basophils Absolute: 0 10*3/uL (ref 0.0–0.1)
Basophils Relative: 1 %
Eosinophils Absolute: 0.1 10*3/uL (ref 0.0–0.5)
Eosinophils Relative: 1 %
HCT: 42.2 % (ref 36.0–46.0)
Hemoglobin: 13.4 g/dL (ref 12.0–15.0)
Immature Granulocytes: 1 %
Lymphocytes Relative: 23 %
Lymphs Abs: 2 10*3/uL (ref 0.7–4.0)
MCH: 25.3 pg — ABNORMAL LOW (ref 26.0–34.0)
MCHC: 31.8 g/dL (ref 30.0–36.0)
MCV: 79.8 fL — ABNORMAL LOW (ref 80.0–100.0)
Monocytes Absolute: 0.5 10*3/uL (ref 0.1–1.0)
Monocytes Relative: 6 %
Neutro Abs: 5.7 10*3/uL (ref 1.7–7.7)
Neutrophils Relative %: 68 %
Platelets: 288 10*3/uL (ref 150–400)
RBC: 5.29 MIL/uL — ABNORMAL HIGH (ref 3.87–5.11)
RDW: 13.7 % (ref 11.5–15.5)
WBC: 8.4 10*3/uL (ref 4.0–10.5)
nRBC: 0 % (ref 0.0–0.2)

## 2023-01-03 LAB — COMPREHENSIVE METABOLIC PANEL
ALT: 9 U/L (ref 0–44)
AST: 14 U/L — ABNORMAL LOW (ref 15–41)
Albumin: 3.9 g/dL (ref 3.5–5.0)
Alkaline Phosphatase: 70 U/L (ref 38–126)
Anion gap: 7 (ref 5–15)
BUN: 13 mg/dL (ref 6–20)
CO2: 25 mmol/L (ref 22–32)
Calcium: 9.5 mg/dL (ref 8.9–10.3)
Chloride: 106 mmol/L (ref 98–111)
Creatinine, Ser: 0.77 mg/dL (ref 0.44–1.00)
GFR, Estimated: >60 mL/min (ref >60)
Glucose, Bld: 104 mg/dL — ABNORMAL HIGH (ref 70–99)
Potassium: 3.9 mmol/L (ref 3.5–5.1)
Sodium: 138 mmol/L (ref 135–145)
Total Bilirubin: 0.3 mg/dL (ref 0.3–1.2)
Total Protein: 7 g/dL (ref 6.5–8.1)

## 2023-01-03 LAB — URINALYSIS, ROUTINE W REFLEX MICROSCOPIC
Bacteria, UA: NONE SEEN
Bilirubin Urine: NEGATIVE
Glucose, UA: NEGATIVE mg/dL
Ketones, ur: NEGATIVE mg/dL
Leukocytes,Ua: NEGATIVE
Nitrite: NEGATIVE
Protein, ur: NEGATIVE mg/dL
Specific Gravity, Urine: 1.014 (ref 1.005–1.030)
pH: 6.5 (ref 5.0–8.0)

## 2023-01-03 LAB — LIPASE, BLOOD: Lipase: 23 U/L (ref 11–51)

## 2023-01-03 LAB — HCG, SERUM, QUALITATIVE: Preg, Serum: NEGATIVE

## 2023-01-03 MED ORDER — IOHEXOL 300 MG/ML  SOLN
100.0000 mL | Freq: Once | INTRAMUSCULAR | Status: AC | PRN
  Administered 2023-01-03: 100 mL via INTRAVENOUS

## 2023-01-03 MED ORDER — ONDANSETRON HCL 4 MG/2ML IJ SOLN
4.0000 mg | Freq: Once | INTRAMUSCULAR | Status: DC
  Filled 2023-01-03: qty 2

## 2023-01-03 MED ORDER — KETOROLAC TROMETHAMINE 15 MG/ML IJ SOLN
15.0000 mg | Freq: Once | INTRAMUSCULAR | Status: AC
  Administered 2023-01-03: 15 mg via INTRAVENOUS
  Filled 2023-01-03: qty 1

## 2023-01-03 MED ORDER — SODIUM CHLORIDE 0.9 % IV BOLUS
500.0000 mL | Freq: Once | INTRAVENOUS | Status: AC
  Administered 2023-01-03: 500 mL via INTRAVENOUS

## 2023-01-03 NOTE — ED Notes (Signed)
Discharge paperwork given and verbally understood. 

## 2023-01-03 NOTE — Discharge Instructions (Addendum)
Your workup today was overall reassuring.  There is no abnormality found on CT image abdomen/pelvis as cause of your abdominal pain.  You did have 2 small right-sided ovarian cysts which could be causing your abdominal discomfort.  Recommend Tylenol/Motrin as needed for abdominal discomfort.  Recommend reevaluation by OB/GYN for reassessment of your symptoms.  Please do not hesitate to return to emergency department for worrisome signs and symptoms we discussed today, parent.

## 2023-01-03 NOTE — ED Triage Notes (Signed)
Pt arrives to ED with c/o RLQ abdominal pain x1 day.

## 2023-01-03 NOTE — ED Provider Notes (Signed)
[facility] Provider Note   CSN: [id number] Arrival date & time: 01/03/23  1301     History  Chief Complaint  Patient presents with   Abdominal Pain    Jennifer Ho is a 32 y.o. female.   Abdominal Pain   32 year old female presents emergency department with complaints of abdominal pain.  Patient states that abdominal pain began last night when she is straining to have a bowel movement.  Notes pain in the right lower quadrant.  Notes that resolution of pain spontaneously before going to bed.  She notes recurrence of pain in similar location when straining to have a bowel movement earlier today.  She went to urgent care who told to come to the emergency department for further assessment.  Reports some associated nausea but denies emesis.  Denies urinary/vaginal symptoms, hematochezia/melena, fever, chills, night sweats, chest pain, shortness of breath.  Patient currently on her menstrual cycle so not concern for pregnancy.  Prior abdominal surgeries include cesarean section.  Has tried no medications for this.  Past medical history significant for bipolar 2 disorder, fibromyalgia, endometriosis, interstitial cystitis, hypothyroidism  Home Medications Prior to Admission medications   Medication Sig Start Date End Date Taking? Authorizing Provider  EPINEPHrine 0.3 mg/0.3 mL IJ SOAJ injection  02/24/18   [provider]  lamoTRIgine (LAMICTAL) 200 MG tablet Take 200 mg by mouth daily.    [provider]  levothyroxine (SYNTHROID) 112 MCG tablet Take 112 mcg by mouth daily.    [provider]  lithium carbonate (ESKALITH) 450 MG ER tablet TAKE 2 TABLETS(900 MG) BY MOUTH DAILY 12/05/22   Cottle, Billey Co., MD  Loperamide HCl (IMODIUM PO) Take by mouth.    [provider]  nystatin ointment (MYCOSTATIN) Apply 1 Application topically to rash on foot (two) times daily as needed. 11/07/22     Probiotic Product  (PROBIOTIC PO) Take by mouth.    [provider]  promethazine (PHENERGAN) 12.5 MG tablet Take 1 tablet (12.5 mg total) by mouth every 8 (eight) hours as needed for nausea or vomiting. 09/06/22   Carvel Getting, NP  risperiDONE (RISPERDAL) 0.5 MG tablet TAKE 1 TABLET(0.5 MG) BY MOUTH TWICE DAILY 12/22/22   Cottle, Billey Co., MD  tirzepatide [facility]) 2.5 MG/0.5ML Pen Inject 2.5 mg into the skin once a week. Patient not taking: Reported on 11/26/2022 09/25/22     tirzepatide (ZEPBOUND) 2.5 MG/0.5ML Pen Inject 2.5 mg into the skin once a week. Patient not taking: Reported on 11/26/2022 11/07/22     tirzepatide (ZEPBOUND) 5 MG/0.5ML Pen Inject 5 mg into the skin once a week. Patient not taking: Reported on 11/26/2022 11/07/22     triamcinolone ointment (KENALOG) 0.1 % Apply 1 Application topically to rash on foot 2 (two) times daily as needed. Patient not taking: Reported on 11/26/2022 11/07/22         Allergies    Letrozole and Vanilla    Review of Systems   Review of Systems  Gastrointestinal:  Positive for abdominal pain.  All other systems reviewed and are negative.   Physical Exam Updated Vital Signs BP 110/67 (BP Location: Right Arm)   Pulse (!) 58   Temp 98.6 F (37 C) (Oral)   Resp 16   Ht 5\' 6"  (1.676 m)   Wt 108.9 kg   SpO2 98%   BMI 38.74 kg/m  Physical Exam Vitals and nursing note reviewed.  Constitutional:  General: She is not in acute distress.    Appearance: She is well-developed.  HENT:     Head: Normocephalic and atraumatic.  Eyes:     Conjunctiva/sclera: Conjunctivae normal.  Cardiovascular:     Rate and Rhythm: Normal rate and regular rhythm.     Heart sounds: No murmur heard. Pulmonary:     Effort: Pulmonary effort is normal. No respiratory distress.     Breath sounds: Normal breath sounds. No wheezing, rhonchi or rales.  Abdominal:     Palpations: Abdomen is soft.     Tenderness: There is abdominal tenderness in the right lower quadrant. There  is no right CVA tenderness, left CVA tenderness, guarding or rebound.  Musculoskeletal:        General: No swelling.     Cervical back: Neck supple.     Right lower leg: No edema.  Skin:    General: Skin is warm and dry.     Capillary Refill: Capillary refill takes less than 2 seconds.  Neurological:     Mental Status: She is alert.  Psychiatric:        Mood and Affect: Mood normal.     ED Results / Procedures / Treatments   Labs (all labs ordered are listed, but only abnormal results are displayed) Labs Reviewed  COMPREHENSIVE METABOLIC PANEL - Abnormal; Notable for the following components:      Result Value   Glucose, Bld 104 (*)    AST 14 (*)    All other components within normal limits  CBC WITH DIFFERENTIAL/PLATELET - Abnormal; Notable for the following components:   RBC 5.29 (*)    MCV 79.8 (*)    MCH 25.3 (*)    All other components within normal limits  URINALYSIS, ROUTINE W REFLEX MICROSCOPIC - Abnormal; Notable for the following components:   Hgb urine dipstick MODERATE (*)    All other components within normal limits  LIPASE, BLOOD  HCG, SERUM, QUALITATIVE    EKG None  Radiology US PELVIC COMPLETE W TRANSVAGINAL AND TORSION R/O  Result Date: 01/03/2023 CLINICAL DATA:  Day history of right lower quadrant pain EXAM: TRANSABDOMINAL AND TRANSVAGINAL ULTRASOUND OF PELVIS DOPPLER ULTRASOUND OF OVARIES TECHNIQUE: Both transabdominal and transvaginal ultrasound examinations of the pelvis were performed. Transabdominal technique was performed for global imaging of the pelvis including uterus, ovaries, adnexal regions, and pelvic cul-de-sac. It was necessary to proceed with endovaginal exam following the transabdominal exam to visualize the bilateral ovaries and adnexa. Color and duplex Doppler ultrasound was utilized to evaluate blood flow to the ovaries. COMPARISON:  CT abdomen and pelvis dated 01/03/2023, ultrasound pelvis dated 08/22/2015 FINDINGS: Uterus Measurements:  9.2 cm in sagittal dimension. Postsurgical changes from prior cesarean section. No fibroids or other mass visualized. Endometrium Thickness: 8 mm.  No focal abnormality visualized. Right ovary Measurements: 2.5 x 1.8 x 1.7 cm = volume: 3.8 mL. Normal appearance. Medial and inferior to the right ovary, there are 2 adjacent anechoic ovoid structures measuring 0.7 x 1.1 x 0.9 cm and 1.3 x 1.2 x 1.1 cm Left ovary Measurements: 2.5 x 2.3 x 1.9 cm = volume: 5.8 mL. Normal appearance. No adnexal mass. Pulsed Doppler evaluation of both ovaries demonstrates normal low-resistance arterial and venous waveforms. Other findings No abnormal free fluid. IMPRESSION: 1. No evidence of ovarian torsion. 2. Two adjacent anechoic ovoid structures medial and inferior to the right ovary measuring 0.7 x 1.1 x 0.9 cm and 1.3 x 1.2 x 1.1 cm. These are favored to represent  small paraovarian cysts. No specific follow-up imaging recommended. Electronically Signed   By: Darrin Nipper M.D.   On: 01/03/2023 16:49   CT Abdomen Pelvis W Contrast  Result Date: 01/03/2023 CLINICAL DATA:  RIGHT lower quadrant abdominal pain for 1 day EXAM: CT ABDOMEN AND PELVIS WITH CONTRAST TECHNIQUE: Multidetector CT imaging of the abdomen and pelvis was performed using the standard protocol following bolus administration of intravenous contrast. RADIATION DOSE REDUCTION: This exam was performed according to the departmental dose-optimization program which includes automated exposure control, adjustment of the mA and/or kV according to patient size and/or use of iterative reconstruction technique. CONTRAST:  122mL OMNIPAQUE IOHEXOL 300 MG/ML SOLN IV. No oral contrast. COMPARISON:  07/02/2015 FINDINGS: Lower chest: Lung bases clear Hepatobiliary: Minimal fatty infiltration of liver. Gallbladder and liver otherwise normal appearance. Pancreas: Normal appearance Spleen: Normal appearance.  Small splenule inferior to spleen. Adrenals/Urinary Tract: Adrenal glands normal  appearance. Kidneys, ureters, and bladder normal appearance. No urinary tract calcification or dilatation. Stomach/Bowel: Normal appendix. Stomach and bowel loops normal appearance. Vascular/Lymphatic: Vascular structures patent.  No adenopathy. Reproductive: Unremarkable uterus and ovaries. Other: No free air or free fluid. No hernia or inflammatory process. Musculoskeletal: Unremarkable. IMPRESSION: Minimal fatty infiltration of liver. No acute intra-abdominal or intrapelvic abnormalities. Electronically Signed   By: Lavonia Dana M.D.   On: 01/03/2023 15:25    Procedures Procedures    Medications Ordered in ED Medications  sodium chloride 0.9 % bolus 500 mL (0 mLs Intravenous Stopped 01/03/23 1409)  ketorolac (TORADOL) 15 MG/ML injection 15 mg (15 mg Intravenous Given 01/03/23 1351)  iohexol (OMNIPAQUE) 300 MG/ML solution 100 mL (100 mLs Intravenous Contrast Given 01/03/23 1509)    ED Course/ Medical Decision Making/ A&P Clinical Course as of 01/03/23 1655  Fri Jan 03, 2023  1539 Upon expecting discharge and going over laboratory/imaging findings, patient requesting pelvic ultrasound for torsion rule out. [CR]    Clinical Course User Index [CR] Wilnette Kales, PA                             Medical Decision Making Amount and/or Complexity of Data Reviewed Labs: ordered. Radiology: ordered.  Risk Prescription drug management.   This patient presents to the ED for concern of abdominal pain, this involves an extensive number of treatment options, and is a complaint that carries with it a high risk of complications and morbidity.  The differential diagnosis includes PUD/gastritis, pancreatitis, CBD pathology, cholecystitis, hepatitis, SBO/LBO, volvulus, diverticulitis, appendicitis, nephrolithiasis, pyelonephritis, cystitis, ectopic pregnancy, ovarian torsion, PID, tubo-ovarian abscess, mesenteric ischemia, AAA, aortic dissection   Co morbidities that complicate the patient  evaluation  See HPI   Additional history obtained:  Additional history obtained from EMR External records from outside source obtained and reviewed including hospital records   Lab Tests:  I Ordered, and personally interpreted labs.  The pertinent results include: No leukocytosis noted.  No evidence of anemia.  Platelets within normal range.  hCG negative.  No electrolyte abnormalities.  No transaminitis.  No renal dysfunction.  UA significant for moderate hemoglobin but otherwise unremarkable.  Lipase within normal limits.   Imaging Studies ordered:  I ordered imaging studies including CT abdomen pelvis, pelvic ultrasound I independently visualized and interpreted imaging which showed  CT abdomen pelvis: No acute intra-abdominal process.  Fatty infiltration of liver. Pelvic ultrasound: No evidence of ovarian torsion.  2 adjacent anechoic ovoid structures medial and inferior to right ovary measuring 2.7  x 1.1 x 0.9 cm and 1.3 x 1.2 x 1.1 cm. I agree with the radiologist interpretation  Cardiac Monitoring: / EKG:  The patient was maintained on a cardiac monitor.  I personally viewed and interpreted the cardiac monitored which showed an underlying rhythm of: Sinus rhythm   Consultations Obtained:  N/a   Problem List / ED Course / Critical interventions / Medication management  Right lower quadrant abdominal pain I ordered medication including Toradol, normal saline   Reevaluation of the patient after these medicines showed that the patient improved I have reviewed the patients home medicines and have made adjustments as needed   Social Determinants of Health:  Denies tobacco, illicit drug use   Test / Admission - Considered:  Right ovarian cyst Vitals signs within normal range and stable throughout visit. Laboratory/imaging studies significant for: See above 32 year old female presents emergency department with complaints of right lower quadrant abdominal pain.  No  CT abnormality indicative of patient's abdominal pain.  Patient's pelvic ultrasound did show 2 small right-sided ovarian cyst which could be cause of patient's abdominal pain but there is no current evidence of ovarian torsion.  Patient with history of patient's laboratory studies relatively unremarkable for acute abnormality.  Patient recommended treatment at home with Tylenol/Motrin as needed for pain recommend follow-up with OB/GYN for reassessment of symptoms.  Patient overall well-appearing, afebrile in no acute distress tolerating p.o. without difficulty.  Treatment plan discussed at length with patient and she acknowledged understanding was agreeable to said plan. Worrisome signs and symptoms were discussed with the patient, and the patient acknowledged understanding to return to the ED if noticed. Patient was stable upon discharge.          Final Clinical Impression(s) / ED Diagnoses Final diagnoses:  Right lower quadrant abdominal pain  Right ovarian cyst    Rx / DC Orders ED Discharge Orders     None         Wilnette Kales, [place] 01/03/23 1655    Dorie Rank, MD 01/06/23 [phone]

## 2023-01-05 ENCOUNTER — Emergency Department (HOSPITAL_BASED_OUTPATIENT_CLINIC_OR_DEPARTMENT_OTHER): Payer: BC Managed Care – PPO | Admitting: Radiology

## 2023-01-05 ENCOUNTER — Emergency Department (HOSPITAL_BASED_OUTPATIENT_CLINIC_OR_DEPARTMENT_OTHER)
Admission: EM | Admit: 2023-01-05 | Discharge: 2023-01-06 | Disposition: A | Discharge status: Home / Self Care | Payer: BC Managed Care – PPO | Attending: Emergency Medicine | Admitting: Emergency Medicine

## 2023-01-05 ENCOUNTER — Encounter (HOSPITAL_BASED_OUTPATIENT_CLINIC_OR_DEPARTMENT_OTHER): Payer: Self-pay

## 2023-01-05 DIAGNOSIS — Z79899 Other long term (current) drug therapy: Secondary | ICD-10-CM | POA: Insufficient documentation

## 2023-01-05 DIAGNOSIS — E039 Hypothyroidism, unspecified: Secondary | ICD-10-CM | POA: Diagnosis not present

## 2023-01-05 DIAGNOSIS — R0602 Shortness of breath: Secondary | ICD-10-CM | POA: Insufficient documentation

## 2023-01-05 DIAGNOSIS — R0789 Other chest pain: Secondary | ICD-10-CM

## 2023-01-05 DIAGNOSIS — R079 Chest pain, unspecified: Secondary | ICD-10-CM | POA: Diagnosis not present

## 2023-01-05 LAB — BASIC METABOLIC PANEL
Anion gap: 10 (ref 5–15)
BUN: 12 mg/dL (ref 6–20)
CO2: 23 mmol/L (ref 22–32)
Calcium: 9.3 mg/dL (ref 8.9–10.3)
Chloride: 105 mmol/L (ref 98–111)
Creatinine, Ser: 0.74 mg/dL (ref 0.44–1.00)
GFR, Estimated: >60 mL/min (ref >60)
Glucose, Bld: 116 mg/dL — ABNORMAL HIGH (ref 70–99)
Potassium: 3.3 mmol/L — ABNORMAL LOW (ref 3.5–5.1)
Sodium: 138 mmol/L (ref 135–145)

## 2023-01-05 LAB — CBC WITH DIFFERENTIAL/PLATELET
Abs Immature Granulocytes: 0.07 10*3/uL (ref 0.00–0.07)
Basophils Absolute: 0.1 10*3/uL (ref 0.0–0.1)
Basophils Relative: 1 %
Eosinophils Absolute: 0.1 10*3/uL (ref 0.0–0.5)
Eosinophils Relative: 1 %
HCT: 38.3 % (ref 36.0–46.0)
Hemoglobin: 12.6 g/dL (ref 12.0–15.0)
Immature Granulocytes: 1 %
Lymphocytes Relative: 25 %
Lymphs Abs: 2.5 10*3/uL (ref 0.7–4.0)
MCH: 26 pg (ref 26.0–34.0)
MCHC: 32.9 g/dL (ref 30.0–36.0)
MCV: 79 fL — ABNORMAL LOW (ref 80.0–100.0)
Monocytes Absolute: 0.6 10*3/uL (ref 0.1–1.0)
Monocytes Relative: 6 %
Neutro Abs: 6.7 10*3/uL (ref 1.7–7.7)
Neutrophils Relative %: 66 %
Platelets: 290 10*3/uL (ref 150–400)
RBC: 4.85 MIL/uL (ref 3.87–5.11)
RDW: 13.7 % (ref 11.5–15.5)
WBC: 10.1 10*3/uL (ref 4.0–10.5)
nRBC: 0 % (ref 0.0–0.2)

## 2023-01-05 LAB — TROPONIN I (HIGH SENSITIVITY): Troponin I (High Sensitivity): <2 ng/L (ref <18)

## 2023-01-05 LAB — BRAIN NATRIURETIC PEPTIDE: B Natriuretic Peptide: 39.5 pg/mL (ref 0.0–100.0)

## 2023-01-05 LAB — LITHIUM LEVEL: Lithium Lvl: 0.31 mmol/L — ABNORMAL LOW (ref 0.60–1.20)

## 2023-01-05 LAB — TSH: TSH: 2.724 u[IU]/mL (ref 0.350–4.500)

## 2023-01-05 MED ORDER — ALBUTEROL SULFATE HFA 108 (90 BASE) MCG/ACT IN AERS: 2.0000 | INHALATION_SPRAY | RESPIRATORY_TRACT | Status: DC | PRN

## 2023-01-05 NOTE — ED Provider Notes (Signed)
[facility] Provider Note   CSN: [id number] Arrival date & time: 01/05/23  2147     History  Chief Complaint  Patient presents with   Chest Pain   Shortness of Breath    Jennifer Ho is a 32 y.o. female.  Patient is a 32 year old female with a past medical history of hypothyroidism, bipolar disorder, fibromyalgia presenting to the emergency department with chest pain and shortness of breath.  Patient states that just prior to arrival to the emergency department she was having sex with her partner when she had sudden onset of chest pain and shortness of breath.  She states that she is having a squeezing pressure type of pain in the midsternal of her chest that is nonradiating.  She states that her partner's thought that she sounded like she was wheezing.  She states that she had a mild nonproductive cough.  She states that she has had swelling in her legs since her pregnancy but is no worse than usual.  She states that her twins are now 24 months old.  She denies any history of blood clots, recent hospitalizations or surgery, recent long travels in the car or plane, hormone use or cancer history.  The history is provided by the patient and the spouse.  Chest Pain Associated symptoms: shortness of breath   Shortness of Breath Associated symptoms: chest pain        Home Medications Prior to Admission medications   Medication Sig Start Date End Date Taking? Authorizing Provider  EPINEPHrine 0.3 mg/0.3 mL IJ SOAJ injection  02/24/18   [provider]  lamoTRIgine (LAMICTAL) 200 MG tablet Take 200 mg by mouth daily.    [provider]  levothyroxine (SYNTHROID) 112 MCG tablet Take 112 mcg by mouth daily.    [provider]  lithium carbonate (ESKALITH) 450 MG ER tablet TAKE 2 TABLETS(900 MG) BY MOUTH DAILY 12/05/22   Cottle, Billey Co., MD  Loperamide HCl (IMODIUM PO) Take by mouth.    [provider]   nystatin ointment (MYCOSTATIN) Apply 1 Application topically to rash on foot (two) times daily as needed. 11/07/22     Probiotic Product (PROBIOTIC PO) Take by mouth.    [provider]  promethazine (PHENERGAN) 12.5 MG tablet Take 1 tablet (12.5 mg total) by mouth every 8 (eight) hours as needed for nausea or vomiting. 09/06/22   Carvel Getting, NP  risperiDONE (RISPERDAL) 0.5 MG tablet TAKE 1 TABLET(0.5 MG) BY MOUTH TWICE DAILY 12/22/22   Cottle, Billey Co., MD  tirzepatide [facility]) 2.5 MG/0.5ML Pen Inject 2.5 mg into the skin once a week. Patient not taking: Reported on 11/26/2022 09/25/22     tirzepatide (ZEPBOUND) 2.5 MG/0.5ML Pen Inject 2.5 mg into the skin once a week. Patient not taking: Reported on 11/26/2022 11/07/22     tirzepatide (ZEPBOUND) 5 MG/0.5ML Pen Inject 5 mg into the skin once a week. Patient not taking: Reported on 11/26/2022 11/07/22     triamcinolone ointment (KENALOG) 0.1 % Apply 1 Application topically to rash on foot 2 (two) times daily as needed. Patient not taking: Reported on 11/26/2022 11/07/22         Allergies    Letrozole and Vanilla    Review of Systems   Review of Systems  Respiratory:  Positive for shortness of breath.   Cardiovascular:  Positive for chest pain.    Physical Exam Updated Vital Signs BP 104/75   Pulse 73  Temp 97.6 F (36.4 C)   Resp 15   SpO2 98%  Physical Exam Vitals and nursing note reviewed.  Constitutional:      General: She is not in acute distress.    Appearance: She is well-developed. She is obese.  HENT:     Head: Normocephalic and atraumatic.  Eyes:     Extraocular Movements: Extraocular movements intact.  Neck:     Vascular: No JVD.  Cardiovascular:     Rate and Rhythm: Normal rate and regular rhythm.     Pulses:          Radial pulses are 2+ on the right side and 2+ on the left side.       Dorsalis pedis pulses are 2+ on the right side and 2+ on the left side.     Heart sounds: Normal heart sounds.   Pulmonary:     Effort: Pulmonary effort is normal. No accessory muscle usage or respiratory distress.     Breath sounds: Normal breath sounds. No wheezing.     Comments: Conversational dyspnea Abdominal:     Palpations: Abdomen is soft.     Tenderness: There is no abdominal tenderness.  Musculoskeletal:        General: Normal range of motion.     Cervical back: Normal range of motion and neck supple.     Right lower leg: No edema.     Left lower leg: No edema.  Skin:    General: Skin is warm and dry.  Neurological:     General: No focal deficit present.     Mental Status: She is alert and oriented to person, place, and time.  Psychiatric:        Mood and Affect: Mood normal.        Behavior: Behavior normal.     ED Results / Procedures / Treatments   Labs (all labs ordered are listed, but only abnormal results are displayed) Labs Reviewed  BASIC METABOLIC PANEL - Abnormal; Notable for the following components:      Result Value   Potassium 3.3 (*)    Glucose, Bld 116 (*)    All other components within normal limits  CBC WITH DIFFERENTIAL/PLATELET - Abnormal; Notable for the following components:   MCV 79.0 (*)    All other components within normal limits  TSH  T4, FREE  LITHIUM LEVEL  BRAIN NATRIURETIC PEPTIDE  TROPONIN I (HIGH SENSITIVITY)    EKG EKG Interpretation  Date/Time:  Sunday January 05 2023 21:56:24 EDT Ventricular Rate:  74 PR Interval:  181 QRS Duration: 115 QT Interval:  406 QTC Calculation: 451 R Axis:   19 Text Interpretation: Sinus rhythm Incomplete right bundle branch block Low voltage, precordial leads No significant change since last tracing Confirmed by Leanord Asal ([id number]) on 01/05/2023 10:02:51 PM  Radiology DG Chest 2 View  Result Date: 01/05/2023 CLINICAL DATA:  Shortness of breath.  Chest pain. EXAM: CHEST - 2 VIEW COMPARISON:  None Available. FINDINGS: Mild central vascular congestion. No focal consolidation, pleural effusion, or  pneumothorax. The cardiac silhouette is within normal limits. No acute osseous pathology. IMPRESSION: Mild central vascular congestion. No focal consolidation. Electronically Signed   By: Anner Crete M.D.   On: 01/05/2023 22:30    Procedures Procedures    Medications Ordered in ED Medications - No data to display  ED Course/ Medical Decision Making/ A&P Clinical Course as of 01/05/23 2304  Nancy Fetter Jan 05, 2023  2303 Patient signed out to Dr.  Delo pending rpt trop and BNP and reassessment. [VK]    Clinical Course User Index [VK] Kemper Durie, DO                             Medical Decision Making This patient presents to the ED with chief complaint(s) of shortness of breath chest pain with pertinent past medical history of hypothyroidism, bipolar disorder, fibromyalgia which further complicates the presenting complaint. The complaint involves an extensive differential diagnosis and also carries with it a high risk of complications and morbidity.    The differential diagnosis includes ACS, arrhythmia, anemia, pneumonia, pneumothorax, pulmonary edema, pleural effusion, bronchospasm unlikely as she has no wheezing on exam, patient is low risk by [facility] criteria making PE unlikely  Additional history obtained: Additional history obtained from spouse Records reviewed outpatient endocrinology records  ED Course and Reassessment: On the patient's arrival to the emergency department she is hemodynamically stable satting well on room air.  She is mildly conversationally dyspneic but in no acute distress.  She does have equal breath sounds bilaterally.  Patient will have EKG, labs and chest x-ray performed.  She also requests having her thyroid function tested as she is bad with getting blood work and is due to have that checked this upcoming week.  She was given albuterol puffs by triage which she reported some improvement there was no wheezing or bronchospasm on exam.  She will be closely  reassessed.  Independent labs interpretation:  The following labs were independently interpreted: initial troponin negative, BNP pending  Independent visualization of imaging: - I independently visualized the following imaging with scope of interpretation limited to determining acute life threatening conditions related to emergency care: CXR, which revealed mild vascular congestion     Amount and/or Complexity of Data Reviewed Labs: ordered. Radiology: ordered.          Final Clinical Impression(s) / ED Diagnoses Final diagnoses:  None    Rx / DC Orders ED Discharge Orders     None         Kemper Durie, DO 01/05/23 2304

## 2023-01-05 NOTE — ED Triage Notes (Signed)
Pt w husband, c/o CP/ tightness, pressure, feeling like "gasping for air." Brought on by exercise, worse w exercise, husband advises that pt was "wheezing during sex but denied CP/ SHOB until after." Sudden onset, denies other sick symptoms

## 2023-01-05 NOTE — ED Notes (Signed)
RT assessed pt for SOB. Pt respiratory status stable on RA w/no distress noted at this time. Pt appears to be very anxious at this time. BLBS clear throughout all lung fields.

## 2023-01-06 LAB — TROPONIN I (HIGH SENSITIVITY): Troponin I (High Sensitivity): <2 ng/L (ref <18)

## 2023-01-06 LAB — T4, FREE: Free T4: 1.03 ng/dL (ref 0.61–1.12)

## 2023-01-06 NOTE — ED Provider Notes (Signed)
  Physical Exam  BP 106/68   Pulse 63   Temp 97.6 F (36.4 C)   Resp 17   SpO2 98%   Physical Exam Vitals and nursing note reviewed.  Constitutional:      General: She is not in acute distress.    Appearance: She is well-developed.  HENT:     Head: Normocephalic and atraumatic.  Pulmonary:     Effort: Pulmonary effort is normal. No tachypnea.  Neurological:     Mental Status: She is alert and oriented to person, place, and time.     Procedures  Procedures  ED Course / MDM   Clinical Course as of 01/06/23 0102  Nancy Fetter Jan 05, 2023  2303 Patient signed out to Dr. Stark Jock pending rpt trop and BNP and reassessment. [VK]    Clinical Course User Index [VK] Kemper Durie, DO   Medical Decision Making Amount and/or Complexity of Data Reviewed Labs: ordered. Radiology: ordered.   Care assumed from Dr. Maylon Peppers at shift change.  Patient awaiting second troponin.  Test has returned and is negative.  Patient to be discharged with outpatient follow-up.  To return as needed.  Doubt cardiac etiology to the patient's symptoms.         Veryl Speak, MD 01/06/23 [phone]

## 2023-01-06 NOTE — Discharge Instructions (Signed)
Continue medications as previously prescribed.  Return to the emergency department if your symptoms significantly worsen or change. 

## 2023-01-07 DIAGNOSIS — F3341 Major depressive disorder, recurrent, in partial remission: Secondary | ICD-10-CM | POA: Diagnosis not present

## 2023-01-09 ENCOUNTER — Telehealth: Payer: Self-pay

## 2023-01-09 NOTE — Transitions of Care (Post Inpatient/ED Visit) (Signed)
   01/09/2023  Name: Jennifer Ho MRN: [id number] DOB: Jun 04, 1991  Today's TOC FU Call Status:    Transition Care Management Follow-up Telephone Call Date of Discharge: 01/05/23 Discharge Facility: [facility] ([facility]) Type of Discharge: Emergency Department Reason for ED Visit: Cardiac Conditions Cardiac Conditions Diagnosis: Chest Pain Persisting How have you been since you were released from the hospital?: Better Any questions or concerns?: Yes Patient Questions/Concerns:: Concerns that there was nothing found to explain symptoms, believes that it may be asthma related Patient Questions/Concerns Addressed: Notified Provider of Patient Questions/Concerns  Items Reviewed: Did you receive and understand the discharge instructions provided?: Yes Medications obtained and verified?: Yes (Medications Reviewed) Any new allergies since your discharge?: No Dietary orders reviewed?: NA Do you have support at home?: Yes People in Home: sibling(s), spouse  Home Care and Equipment/Supplies: [place] Ordered?: NA Any new equipment or medical supplies ordered?: NA  Functional Questionnaire: Do you need assistance with bathing/showering or dressing?: No Do you need assistance with meal preparation?: No Do you need assistance with eating?: No Do you have difficulty maintaining continence: No Do you need assistance with getting out of bed/getting out of a chair/moving?: No Do you have difficulty managing or taking your medications?: No  Follow up appointments reviewed: PCP Follow-up appointment confirmed?: Yes Date of PCP follow-up appointment?: 01/13/23 Follow-up Provider: Esther Hardy, MD [facility] Follow-up appointment confirmed?: NA Do you need transportation to your follow-up appointment?: No Do you understand care options if your condition(s) worsen?: Yes-patient verbalized understanding    [place], [place]

## 2023-01-13 ENCOUNTER — Encounter: Payer: Self-pay | Admitting: Family Medicine

## 2023-01-13 ENCOUNTER — Other Ambulatory Visit (HOSPITAL_COMMUNITY): Payer: Self-pay

## 2023-01-13 ENCOUNTER — Ambulatory Visit: Payer: BC Managed Care – PPO | Admitting: Family Medicine

## 2023-01-13 VITALS — BP 110/80 | HR 78 | Temp 98.3°F | Ht 66.0 in | Wt 236.1 lb

## 2023-01-13 DIAGNOSIS — R0989 Other specified symptoms and signs involving the circulatory and respiratory systems: Secondary | ICD-10-CM | POA: Diagnosis not present

## 2023-01-13 DIAGNOSIS — R1031 Right lower quadrant pain: Secondary | ICD-10-CM

## 2023-01-13 DIAGNOSIS — R0602 Shortness of breath: Secondary | ICD-10-CM | POA: Diagnosis not present

## 2023-01-13 DIAGNOSIS — G8929 Other chronic pain: Secondary | ICD-10-CM

## 2023-01-13 DIAGNOSIS — N809 Endometriosis, unspecified: Secondary | ICD-10-CM | POA: Diagnosis not present

## 2023-01-13 DIAGNOSIS — G4489 Other headache syndrome: Secondary | ICD-10-CM

## 2023-01-13 NOTE — Patient Instructions (Signed)
Referral to card  CT chest.  Colace 100 mg daily-zepbound
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
## 2023-01-20 ENCOUNTER — Ambulatory Visit
Admission: RE | Admit: 2023-01-20 | Discharge: 2023-01-20 | Disposition: A | Discharge status: Home / Self Care | Payer: BC Managed Care – PPO | Source: Ambulatory Visit | Attending: Family Medicine | Admitting: Family Medicine

## 2023-01-20 DIAGNOSIS — R0602 Shortness of breath: Secondary | ICD-10-CM | POA: Diagnosis not present

## 2023-01-20 DIAGNOSIS — R0989 Other specified symptoms and signs involving the circulatory and respiratory systems: Secondary | ICD-10-CM

## 2023-01-21 DIAGNOSIS — F3341 Major depressive disorder, recurrent, in partial remission: Secondary | ICD-10-CM | POA: Diagnosis not present

## 2023-01-22 ENCOUNTER — Other Ambulatory Visit (HOSPITAL_COMMUNITY): Payer: Self-pay

## 2023-01-22 DIAGNOSIS — G4733 Obstructive sleep apnea (adult) (pediatric): Secondary | ICD-10-CM | POA: Diagnosis not present

## 2023-01-22 DIAGNOSIS — R7303 Prediabetes: Secondary | ICD-10-CM | POA: Diagnosis not present

## 2023-01-22 MED ORDER — ZEPBOUND 7.5 MG/0.5ML ~~LOC~~ SOAJ
7.5000 mg | SUBCUTANEOUS | 0 refills | Status: DC
  Filled 2023-01-22: qty 2, 28d supply, fill #0

## 2023-01-23 ENCOUNTER — Other Ambulatory Visit: Payer: Self-pay | Admitting: Psychiatry

## 2023-01-23 ENCOUNTER — Other Ambulatory Visit (HOSPITAL_COMMUNITY): Payer: Self-pay

## 2023-01-23 DIAGNOSIS — F331 Major depressive disorder, recurrent, moderate: Secondary | ICD-10-CM

## 2023-01-23 DIAGNOSIS — F4310 Post-traumatic stress disorder, unspecified: Secondary | ICD-10-CM

## 2023-01-24 DIAGNOSIS — H0102A Squamous blepharitis right eye, upper and lower eyelids: Secondary | ICD-10-CM | POA: Diagnosis not present

## 2023-01-24 DIAGNOSIS — R519 Headache, unspecified: Secondary | ICD-10-CM | POA: Diagnosis not present

## 2023-01-24 DIAGNOSIS — G43B0 Ophthalmoplegic migraine, not intractable: Secondary | ICD-10-CM | POA: Diagnosis not present

## 2023-01-24 DIAGNOSIS — H04123 Dry eye syndrome of bilateral lacrimal glands: Secondary | ICD-10-CM | POA: Diagnosis not present

## 2023-01-25 ENCOUNTER — Encounter (HOSPITAL_BASED_OUTPATIENT_CLINIC_OR_DEPARTMENT_OTHER): Payer: Self-pay | Admitting: Emergency Medicine

## 2023-01-25 ENCOUNTER — Other Ambulatory Visit: Payer: Self-pay

## 2023-01-25 ENCOUNTER — Emergency Department (HOSPITAL_BASED_OUTPATIENT_CLINIC_OR_DEPARTMENT_OTHER): Payer: BC Managed Care – PPO | Admitting: Radiology

## 2023-01-25 ENCOUNTER — Emergency Department (HOSPITAL_BASED_OUTPATIENT_CLINIC_OR_DEPARTMENT_OTHER)
Admission: EM | Admit: 2023-01-25 | Discharge: 2023-01-25 | Disposition: A | Discharge status: Home / Self Care | Payer: BC Managed Care – PPO | Attending: Emergency Medicine | Admitting: Emergency Medicine

## 2023-01-25 DIAGNOSIS — W010XXA Fall on same level from slipping, tripping and stumbling without subsequent striking against object, initial encounter: Secondary | ICD-10-CM | POA: Insufficient documentation

## 2023-01-25 DIAGNOSIS — S92425A Nondisplaced fracture of distal phalanx of left great toe, initial encounter for closed fracture: Secondary | ICD-10-CM | POA: Diagnosis not present

## 2023-01-25 DIAGNOSIS — S92415A Nondisplaced fracture of proximal phalanx of left great toe, initial encounter for closed fracture: Secondary | ICD-10-CM | POA: Diagnosis not present

## 2023-01-25 DIAGNOSIS — M79675 Pain in left toe(s): Secondary | ICD-10-CM | POA: Diagnosis not present

## 2023-01-25 MED ORDER — ACETAMINOPHEN 500 MG PO TABS
1000.0000 mg | ORAL_TABLET | Freq: Once | ORAL | Status: AC
  Administered 2023-01-25: 1000 mg via ORAL
  Filled 2023-01-25: qty 2

## 2023-01-25 NOTE — Discharge Instructions (Addendum)
Please call the orthopedic specialist I have attached here for you for your nondisplaced toe fracture.  Today your sensation/motor skills/pulses were intact and reasonable for discharge.  Please keep the boot on and avoid any physical activity until you are able to see the orthopedic specialist.  You may alternate between Tylenol and ibuprofen every 6 hours as needed for pain.  You may ice your toe as well.  If you begin to experience skin color changes, decreased change in sensation/motor skills, toe that begins to feel rigid please return to ER.

## 2023-01-25 NOTE — ED Triage Notes (Signed)
Pt via pov from home after mechanical fall. She tripped getting out of a playpen. Pt states her great toe of left foot bent under the foot. Pt alert & oriented, nad noted.
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
## 2023-01-27 ENCOUNTER — Ambulatory Visit: Payer: BC Managed Care – PPO | Admitting: Psychiatry

## 2023-01-27 ENCOUNTER — Encounter: Payer: Self-pay | Admitting: Psychiatry

## 2023-01-27 DIAGNOSIS — M797 Fibromyalgia: Secondary | ICD-10-CM

## 2023-01-27 DIAGNOSIS — F4310 Post-traumatic stress disorder, unspecified: Secondary | ICD-10-CM | POA: Diagnosis not present

## 2023-01-27 DIAGNOSIS — F900 Attention-deficit hyperactivity disorder, predominantly inattentive type: Secondary | ICD-10-CM

## 2023-01-27 DIAGNOSIS — R45851 Suicidal ideations: Secondary | ICD-10-CM

## 2023-01-27 DIAGNOSIS — F5105 Insomnia due to other mental disorder: Secondary | ICD-10-CM

## 2023-01-27 DIAGNOSIS — F339 Major depressive disorder, recurrent, unspecified: Secondary | ICD-10-CM | POA: Diagnosis not present

## 2023-01-27 DIAGNOSIS — F331 Major depressive disorder, recurrent, moderate: Secondary | ICD-10-CM

## 2023-01-27 MED ORDER — LITHIUM CARBONATE ER 450 MG PO TBCR: 900.0000 mg | EXTENDED_RELEASE_TABLET | Freq: Every evening | ORAL | 3 refills | Status: DC

## 2023-01-27 MED ORDER — RISPERIDONE 0.5 MG PO TABS: ORAL_TABLET | ORAL | 1 refills | Status: DC
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
## 2023-01-28 ENCOUNTER — Ambulatory Visit (INDEPENDENT_AMBULATORY_CARE_PROVIDER_SITE_OTHER): Payer: BC Managed Care – PPO | Admitting: Surgical

## 2023-01-28 DIAGNOSIS — S92425A Nondisplaced fracture of distal phalanx of left great toe, initial encounter for closed fracture: Secondary | ICD-10-CM

## 2023-01-28 MED ORDER — DOXYCYCLINE HYCLATE 100 MG PO CAPS: 100.0000 mg | ORAL_CAPSULE | Freq: Two times a day (BID) | ORAL | 0 refills | Status: AC

## 2023-01-29 ENCOUNTER — Encounter: Payer: Self-pay | Admitting: Orthopedic Surgery
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
## 2023-02-03 DIAGNOSIS — F3341 Major depressive disorder, recurrent, in partial remission: Secondary | ICD-10-CM | POA: Diagnosis not present

## 2023-02-07 ENCOUNTER — Ambulatory Visit (INDEPENDENT_AMBULATORY_CARE_PROVIDER_SITE_OTHER): Payer: BC Managed Care – PPO | Admitting: Cardiology

## 2023-02-07 ENCOUNTER — Encounter (HOSPITAL_BASED_OUTPATIENT_CLINIC_OR_DEPARTMENT_OTHER): Payer: Self-pay | Admitting: Cardiology

## 2023-02-07 VITALS — BP 114/73 | HR 73 | Ht 66.0 in | Wt 231.4 lb

## 2023-02-07 DIAGNOSIS — R0602 Shortness of breath: Secondary | ICD-10-CM

## 2023-02-07 DIAGNOSIS — Z7189 Other specified counseling: Secondary | ICD-10-CM | POA: Diagnosis not present

## 2023-02-07 DIAGNOSIS — Z712 Person consulting for explanation of examination or test findings: Secondary | ICD-10-CM | POA: Diagnosis not present

## 2023-02-07 NOTE — Patient Instructions (Signed)
Medication Instructions:  The current medical regimen is effective;  continue present plan and medications.   *If you need a refill on your cardiac medications before your next appointment, please call your pharmacy*   Lab Work: None   Testing/Procedures: None   Follow-Up: At [facility], you and your health needs are our priority.  As part of our continuing mission to provide you with exceptional heart care, we have created designated Provider Care Teams.  These Care Teams include your primary Cardiologist (physician) and Advanced Practice Providers (APPs -  Physician Assistants and Nurse Practitioners) who all work together to provide you with the care you need, when you need it.  We recommend signing up for the patient portal called "MyChart".  Sign up information is provided on this After Visit Summary.  MyChart is used to connect with patients for Virtual Visits (Telemedicine).  Patients are able to view lab/test results, encounter notes, upcoming appointments, etc.  Non-urgent messages can be sent to your provider as well.   To learn more about what you can do with MyChart, go to [email or web address].    Your next appointment:   Prn   Provider:   None    Other Instructions None
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
## 2023-02-09 ENCOUNTER — Telehealth: Payer: Self-pay | Admitting: Surgical

## 2023-02-09 NOTE — Telephone Encounter (Signed)
Patient called office on-call number this morning.  She was previously seen for great toe fracture and states that she has been progressing well.  This fracture had a small open component with injury to the nail margin with bleeding at the time of initial injury.  She states that she has been taking doxycycline as directed but over the last 24 to 48 hours, she has had increased pain without any significant increase in her activity recently.  She feels the toe is swollen.  She denies any fevers or chills or drainage.  Recommended that she elevate the toe and try and only do activity as necessary today and see how her pain trends.  Did review the pictures that she sent on my chart which show mild erythema and swelling just proximal to the nail compared with the contralateral toe.  If she is still having symptoms this evening and throughout the night, recommended she call the office tomorrow to be worked in to see me in the office for evaluation.  Patient agreed with plan.  Recommend she call me back if there are any new developments.

## 2023-02-10 ENCOUNTER — Encounter: Payer: Self-pay | Admitting: Surgical

## 2023-02-10 ENCOUNTER — Other Ambulatory Visit (INDEPENDENT_AMBULATORY_CARE_PROVIDER_SITE_OTHER): Payer: BC Managed Care – PPO

## 2023-02-10 ENCOUNTER — Other Ambulatory Visit: Payer: Self-pay

## 2023-02-10 ENCOUNTER — Other Ambulatory Visit: Payer: Self-pay | Admitting: Obstetrics and Gynecology

## 2023-02-10 ENCOUNTER — Ambulatory Visit (INDEPENDENT_AMBULATORY_CARE_PROVIDER_SITE_OTHER): Payer: BC Managed Care – PPO | Admitting: Surgical

## 2023-02-10 DIAGNOSIS — N946 Dysmenorrhea, unspecified: Secondary | ICD-10-CM

## 2023-02-10 DIAGNOSIS — S92425D Nondisplaced fracture of distal phalanx of left great toe, subsequent encounter for fracture with routine healing: Secondary | ICD-10-CM | POA: Diagnosis not present

## 2023-02-10 DIAGNOSIS — S92425A Nondisplaced fracture of distal phalanx of left great toe, initial encounter for closed fracture: Secondary | ICD-10-CM

## 2023-02-10 DIAGNOSIS — N9412 Deep dyspareunia: Secondary | ICD-10-CM

## 2023-02-10 DIAGNOSIS — N809 Endometriosis, unspecified: Secondary | ICD-10-CM

## 2023-02-10 DIAGNOSIS — R102 Pelvic and perineal pain: Secondary | ICD-10-CM

## 2023-02-10 NOTE — Telephone Encounter (Signed)
Coming in today

## 2023-02-11 ENCOUNTER — Encounter: Payer: Self-pay | Admitting: Surgical
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
## 2023-02-14 ENCOUNTER — Other Ambulatory Visit (HOSPITAL_COMMUNITY): Payer: Self-pay

## 2023-02-15 ENCOUNTER — Telehealth: Payer: BC Managed Care – PPO | Admitting: Family Medicine

## 2023-02-15 DIAGNOSIS — H1033 Unspecified acute conjunctivitis, bilateral: Secondary | ICD-10-CM | POA: Diagnosis not present

## 2023-02-15 MED ORDER — POLYMYXIN B-TRIMETHOPRIM 10000-0.1 UNIT/ML-% OP SOLN: 2.0000 [drp] | Freq: Four times a day (QID) | OPHTHALMIC | 0 refills | Status: AC

## 2023-02-15 NOTE — Patient Instructions (Signed)
Bacterial Conjunctivitis, Adult Bacterial conjunctivitis is an infection of the clear membrane that covers the white part of the eye and the inner surface of the eyelid (conjunctiva). When the blood vessels in the conjunctiva become inflamed, the eye becomes red or pink. The eye often feels irritated or itchy. Bacterial conjunctivitis spreads easily from person to person (is contagious). It also spreads easily from one eye to the other eye. What are the causes? This condition is caused by bacteria. You may get the infection if you come into close contact with: A person who is infected with the bacteria. Items that are contaminated with the bacteria, such as a face towel, contact lens solution, or eye makeup. What increases the risk? You are more likely to develop this condition if: You are exposed to other people who have the infection. You wear contact lenses. You have a sinus infection. You have had a recent eye injury or surgery. You have a weak body defense system (immune system). You have a medical condition that causes dry eyes. What are the signs or symptoms? Symptoms of this condition include: Thick, yellowish discharge from the eye. This may turn into a crust on the eyelid overnight and cause your eyelids to stick together. Tearing or watery eyes. Itchy eyes. Burning feeling in your eyes. Eye redness. Swollen eyelids. Blurred vision. How is this diagnosed? This condition is diagnosed based on your symptoms and medical history. Your health care provider may also take a sample of discharge from your eye to find the cause of your infection. How is this treated? This condition may be treated with: Antibiotic eye drops or ointment to clear the infection more quickly and prevent the spread of infection to others. Antibiotic medicines taken by mouth (orally) to treat infections that do not respond to drops or ointments or that last longer than 10 days. Cool, wet cloths (cool  compresses) placed on the eyes. Artificial tears applied 2-6 times a day. Follow these instructions at home: Medicines Take or apply your antibiotic medicine as told by your health care provider. Do not stop using the antibiotic, even if your condition improves, unless directed by your health care provider. Take or apply over-the-counter and prescription medicines only as told by your health care provider. Be very careful to avoid touching the edge of your eyelid with the eye-drop bottle or the ointment tube when you apply medicines to the affected eye. This will keep you from spreading the infection to your other eye or to other people. Managing discomfort Gently wipe away any drainage from your eye with a warm, wet washcloth or a cotton ball. Apply a clean, cool compress to your eye for 10-20 minutes, 3-4 times a day. General instructions Do not wear contact lenses until the inflammation is gone and your health care provider says it is safe to wear them again. Ask your health care provider how to sterilize or replace your contact lenses before you use them again. Wear glasses until you can resume wearing contact lenses. Avoid wearing eye makeup until the inflammation is gone. Throw away any old eye cosmetics that may be contaminated. Change or wash your pillowcase every day. Do not share towels or washcloths. This may spread the infection. Wash your hands often with soap and water for at least 20 seconds and especially before touching your face or eyes. Use paper towels to dry your hands. Avoid touching or rubbing your eyes. Do not drive or use heavy machinery if your vision is blurred. Contact   a health care provider if: You have a fever. Your symptoms do not get better after 10 days. Get help right away if: You have a fever and your symptoms suddenly get worse. You have severe pain when you move your eye. You have facial pain, redness, or swelling. You have a sudden loss of  vision. Summary Bacterial conjunctivitis is an infection of the clear membrane that covers the white part of the eye and the inner surface of the eyelid (conjunctiva). Bacterial conjunctivitis spreads easily from eye to eye and from person to person (is contagious). Wash your hands often with soap and water for at least 20 seconds and especially before touching your face or eyes. Use paper towels to dry your hands. Take or apply your antibiotic medicine as told by your health care provider. Do not stop using the antibiotic even if your condition improves. Contact a health care provider if you have a fever or if your symptoms do not get better after 10 days. Get help right away if you have a sudden loss of vision. This information is not intended to replace advice given to you by your health care provider. Make sure you discuss any questions you have with your health care provider. Document Revised: 01/03/2021 Document Reviewed: 01/03/2021 Elsevier Patient Education  2023 [employer]  
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
## 2023-02-17 ENCOUNTER — Ambulatory Visit: Payer: BC Managed Care – PPO | Admitting: Orthopedic Surgery

## 2023-02-18 ENCOUNTER — Encounter: Payer: Self-pay | Admitting: Obstetrics and Gynecology

## 2023-02-18 ENCOUNTER — Other Ambulatory Visit (HOSPITAL_COMMUNITY): Payer: Self-pay

## 2023-02-21 ENCOUNTER — Ambulatory Visit: Payer: BC Managed Care – PPO | Admitting: Orthopedic Surgery

## 2023-02-24 ENCOUNTER — Other Ambulatory Visit (HOSPITAL_COMMUNITY): Payer: Self-pay

## 2023-02-24 ENCOUNTER — Ambulatory Visit (INDEPENDENT_AMBULATORY_CARE_PROVIDER_SITE_OTHER): Payer: BC Managed Care – PPO | Admitting: Surgical

## 2023-02-24 ENCOUNTER — Encounter: Payer: Self-pay | Admitting: Surgical

## 2023-02-24 DIAGNOSIS — Z9189 Other specified personal risk factors, not elsewhere classified: Secondary | ICD-10-CM | POA: Diagnosis not present

## 2023-02-24 DIAGNOSIS — G43109 Migraine with aura, not intractable, without status migrainosus: Secondary | ICD-10-CM | POA: Diagnosis not present

## 2023-02-24 DIAGNOSIS — S92425D Nondisplaced fracture of distal phalanx of left great toe, subsequent encounter for fracture with routine healing: Secondary | ICD-10-CM

## 2023-02-24 DIAGNOSIS — Z6835 Body mass index (BMI) 35.0-35.9, adult: Secondary | ICD-10-CM | POA: Diagnosis not present

## 2023-02-24 MED ORDER — ZEPBOUND 5 MG/0.5ML ~~LOC~~ SOAJ
5.0000 mg | SUBCUTANEOUS | 0 refills | Status: AC
  Filled 2023-02-24: qty 2, 28d supply, fill #0
  Filled 2023-04-04: qty 6, 84d supply, fill #0

## 2023-02-26 ENCOUNTER — Ambulatory Visit
Admission: RE | Admit: 2023-02-26 | Discharge: 2023-02-26 | Disposition: A | Discharge status: Home / Self Care | Payer: BC Managed Care – PPO | Source: Ambulatory Visit | Attending: Obstetrics and Gynecology | Admitting: Obstetrics and Gynecology

## 2023-02-26 DIAGNOSIS — N9412 Deep dyspareunia: Secondary | ICD-10-CM

## 2023-02-26 DIAGNOSIS — N809 Endometriosis, unspecified: Secondary | ICD-10-CM

## 2023-02-26 DIAGNOSIS — N946 Dysmenorrhea, unspecified: Secondary | ICD-10-CM

## 2023-02-26 DIAGNOSIS — R102 Pelvic and perineal pain: Secondary | ICD-10-CM

## 2023-02-26 MED ORDER — GADOPICLENOL 0.5 MMOL/ML IV SOLN
10.0000 mL | Freq: Once | INTRAVENOUS | Status: AC | PRN
  Administered 2023-02-26: 10 mL via INTRAVENOUS

## 2023-02-27 DIAGNOSIS — F3341 Major depressive disorder, recurrent, in partial remission: Secondary | ICD-10-CM | POA: Diagnosis not present

## 2023-03-01 ENCOUNTER — Other Ambulatory Visit (HOSPITAL_COMMUNITY): Payer: Self-pay

## 2023-03-02 ENCOUNTER — Encounter: Payer: Self-pay | Admitting: Surgical
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
## 2023-03-06 ENCOUNTER — Ambulatory Visit: Payer: BC Managed Care – PPO | Admitting: Orthopedic Surgery

## 2023-03-07 ENCOUNTER — Other Ambulatory Visit (HOSPITAL_COMMUNITY): Payer: Self-pay

## 2023-03-11 ENCOUNTER — Ambulatory Visit: Payer: BC Managed Care – PPO | Admitting: Orthopedic Surgery

## 2023-03-13 ENCOUNTER — Other Ambulatory Visit (HOSPITAL_COMMUNITY): Payer: Self-pay

## 2023-03-13 MED ORDER — ZEPBOUND 7.5 MG/0.5ML ~~LOC~~ SOAJ
7.5000 mg | SUBCUTANEOUS | 0 refills | Status: DC
  Filled 2023-03-13: qty 2, 28d supply, fill #0

## 2023-03-14 ENCOUNTER — Other Ambulatory Visit (HOSPITAL_COMMUNITY): Payer: Self-pay

## 2023-03-17 DIAGNOSIS — F3341 Major depressive disorder, recurrent, in partial remission: Secondary | ICD-10-CM | POA: Diagnosis not present

## 2023-03-20 ENCOUNTER — Ambulatory Visit: Payer: BC Managed Care – PPO | Admitting: Orthopedic Surgery

## 2023-03-27 ENCOUNTER — Ambulatory Visit (INDEPENDENT_AMBULATORY_CARE_PROVIDER_SITE_OTHER): Payer: BC Managed Care – PPO | Admitting: Orthopedic Surgery

## 2023-03-27 DIAGNOSIS — M21371 Foot drop, right foot: Secondary | ICD-10-CM | POA: Diagnosis not present

## 2023-03-27 DIAGNOSIS — M21372 Foot drop, left foot: Secondary | ICD-10-CM | POA: Diagnosis not present

## 2023-04-04 ENCOUNTER — Other Ambulatory Visit: Payer: Self-pay

## 2023-04-04 ENCOUNTER — Other Ambulatory Visit (HOSPITAL_COMMUNITY): Payer: Self-pay

## 2023-04-08 ENCOUNTER — Other Ambulatory Visit (HOSPITAL_COMMUNITY): Payer: Self-pay

## 2023-04-13 ENCOUNTER — Emergency Department (HOSPITAL_BASED_OUTPATIENT_CLINIC_OR_DEPARTMENT_OTHER)
Admission: EM | Admit: 2023-04-13 | Discharge: 2023-04-13 | Disposition: A | Discharge status: Home / Self Care | Payer: BC Managed Care – PPO | Attending: Emergency Medicine | Admitting: Emergency Medicine

## 2023-04-13 ENCOUNTER — Emergency Department (HOSPITAL_BASED_OUTPATIENT_CLINIC_OR_DEPARTMENT_OTHER): Payer: BC Managed Care – PPO

## 2023-04-13 ENCOUNTER — Encounter (HOSPITAL_BASED_OUTPATIENT_CLINIC_OR_DEPARTMENT_OTHER): Payer: Self-pay

## 2023-04-13 DIAGNOSIS — N281 Cyst of kidney, acquired: Secondary | ICD-10-CM | POA: Diagnosis not present

## 2023-04-13 DIAGNOSIS — R109 Unspecified abdominal pain: Secondary | ICD-10-CM

## 2023-04-13 DIAGNOSIS — R102 Pelvic and perineal pain: Secondary | ICD-10-CM | POA: Diagnosis not present

## 2023-04-13 DIAGNOSIS — R110 Nausea: Secondary | ICD-10-CM | POA: Diagnosis not present

## 2023-04-13 DIAGNOSIS — N83291 Other ovarian cyst, right side: Secondary | ICD-10-CM | POA: Diagnosis not present

## 2023-04-13 LAB — CBC WITH DIFFERENTIAL/PLATELET
Abs Immature Granulocytes: 0.04 10*3/uL (ref 0.00–0.07)
Basophils Absolute: 0 10*3/uL (ref 0.0–0.1)
Basophils Relative: 0 %
Eosinophils Absolute: 0.2 10*3/uL (ref 0.0–0.5)
Eosinophils Relative: 2 %
HCT: 44.2 % (ref 36.0–46.0)
Hemoglobin: 14.4 g/dL (ref 12.0–15.0)
Immature Granulocytes: 0 %
Lymphocytes Relative: 25 %
Lymphs Abs: 2.6 10*3/uL (ref 0.7–4.0)
MCH: 25.9 pg — ABNORMAL LOW (ref 26.0–34.0)
MCHC: 32.6 g/dL (ref 30.0–36.0)
MCV: 79.4 fL — ABNORMAL LOW (ref 80.0–100.0)
Monocytes Absolute: 0.7 10*3/uL (ref 0.1–1.0)
Monocytes Relative: 7 %
Neutro Abs: 6.6 10*3/uL (ref 1.7–7.7)
Neutrophils Relative %: 66 %
Platelets: 313 10*3/uL (ref 150–400)
RBC: 5.57 MIL/uL — ABNORMAL HIGH (ref 3.87–5.11)
RDW: 14.6 % (ref 11.5–15.5)
WBC: 10.2 10*3/uL (ref 4.0–10.5)
nRBC: 0 % (ref 0.0–0.2)

## 2023-04-13 LAB — COMPREHENSIVE METABOLIC PANEL
ALT: 9 U/L (ref 0–44)
AST: 14 U/L — ABNORMAL LOW (ref 15–41)
Albumin: 4.5 g/dL (ref 3.5–5.0)
Alkaline Phosphatase: 75 U/L (ref 38–126)
Anion gap: 8 (ref 5–15)
BUN: 10 mg/dL (ref 6–20)
CO2: 27 mmol/L (ref 22–32)
Calcium: 10.6 mg/dL — ABNORMAL HIGH (ref 8.9–10.3)
Chloride: 102 mmol/L (ref 98–111)
Creatinine, Ser: 0.73 mg/dL (ref 0.44–1.00)
GFR, Estimated: >60 mL/min (ref >60)
Glucose, Bld: 91 mg/dL (ref 70–99)
Potassium: 3.3 mmol/L — ABNORMAL LOW (ref 3.5–5.1)
Sodium: 137 mmol/L (ref 135–145)
Total Bilirubin: 0.7 mg/dL (ref 0.3–1.2)
Total Protein: 7.1 g/dL (ref 6.5–8.1)

## 2023-04-13 LAB — URINALYSIS, ROUTINE W REFLEX MICROSCOPIC
Bilirubin Urine: NEGATIVE
Glucose, UA: NEGATIVE mg/dL
Leukocytes,Ua: NEGATIVE
Nitrite: NEGATIVE
Protein, ur: NEGATIVE mg/dL
Specific Gravity, Urine: 1.017 (ref 1.005–1.030)
pH: 8 (ref 5.0–8.0)

## 2023-04-13 LAB — LIPASE, BLOOD: Lipase: 21 U/L (ref 11–51)

## 2023-04-13 LAB — HCG, SERUM, QUALITATIVE: Preg, Serum: NEGATIVE

## 2023-04-13 MED ORDER — IOHEXOL 300 MG/ML  SOLN
100.0000 mL | Freq: Once | INTRAMUSCULAR | Status: AC | PRN
  Administered 2023-04-13: 100 mL via INTRAVENOUS

## 2023-04-13 MED ORDER — ONDANSETRON HCL 4 MG/2ML IJ SOLN
4.0000 mg | Freq: Once | INTRAMUSCULAR | Status: AC
  Administered 2023-04-13: 4 mg via INTRAVENOUS
  Filled 2023-04-13: qty 2

## 2023-04-13 MED ORDER — OXYCODONE HCL 5 MG PO TABS: 5.0000 mg | ORAL_TABLET | ORAL | 0 refills | Status: AC | PRN

## 2023-04-13 MED ORDER — DICYCLOMINE HCL 10 MG PO CAPS
20.0000 mg | ORAL_CAPSULE | Freq: Once | ORAL | Status: AC
  Administered 2023-04-13: 20 mg via ORAL
  Filled 2023-04-13: qty 2

## 2023-04-13 MED ORDER — OXYCODONE HCL 5 MG PO TABS
5.0000 mg | ORAL_TABLET | Freq: Once | ORAL | Status: DC
  Filled 2023-04-13: qty 1

## 2023-04-13 MED ORDER — HYDROMORPHONE HCL 1 MG/ML IJ SOLN
1.0000 mg | Freq: Once | INTRAMUSCULAR | Status: AC
  Administered 2023-04-13: 1 mg via INTRAVENOUS
  Filled 2023-04-13: qty 1

## 2023-04-13 NOTE — Discharge Instructions (Signed)
Follow-up with your OB/GYN.  Recommend continue taking ibuprofen and Tylenol for pain as well.  You can take Roxicodone for breakthrough pain.  Do not mix with any alcohol or drugs or dangerous activities including driving as this medication is sedating.

## 2023-04-13 NOTE — ED Notes (Signed)
Patient up to use bedside commode with assistance of her husband.

## 2023-04-13 NOTE — ED Triage Notes (Signed)
Pt states she is on day 3 of her period. States "worst cramps" she has ever had.  Reports hx of endometriosis. +nausea and decreased appetite

## 2023-04-13 NOTE — ED Provider Notes (Signed)
[place] EMERGENCY DEPARTMENT AT [facility] Provider Note   CSN: [id number] Arrival date & time: 04/13/23  2124     History  Chief Complaint  Patient presents with   Abdominal Cramping    Jennifer Ho is a 32 y.o. female.  Patient here with abdominal cramps.  She is currently on her menstrual cycle.  History of endometriosis.  She has had some nausea.  Pain all over at times.  Nothing makes it worse or better.  Denies any fevers or chills.  Denies any chest pain or shortness of breath or weakness or numbness or tingling.  She not on any birth control.  Her menstrual cycles have been fairly regular since giving birth 18 months ago.  The history is provided by the patient.       Home Medications Prior to Admission medications   Medication Sig Start Date End Date Taking? Authorizing Provider  oxyCODONE (ROXICODONE) 5 MG immediate release tablet Take 1 tablet (5 mg total) by mouth every 4 (four) hours as needed for up to 15 doses for severe pain. 04/13/23  Yes Danyeal Akens, DO  doxycycline (VIBRAMYCIN) 100 MG capsule Take 1 capsule (100 mg total) by mouth 2 (two) times daily. 01/28/23   Magnant, Charles L, PA-C  EPINEPHrine 0.3 mg/0.3 mL IJ SOAJ injection  02/24/18   [provider]  ibuprofen (ADVIL) 600 MG tablet Take by mouth as needed. 10/07/21   [provider]  Lactase 9000 units TABS Take by mouth. 04/29/18   [provider]  lamoTRIgine (LAMICTAL) 200 MG tablet Take 200 mg by mouth daily.    [provider]  levothyroxine (SYNTHROID) 125 MCG tablet Take 1 tablet by mouth daily. 10/17/22   [provider]  lithium carbonate (ESKALITH) 450 MG ER tablet Take 2 tablets (900 mg total) by mouth at bedtime. 01/27/23   Cottle, Steva Ready., MD  nystatin ointment (MYCOSTATIN) Apply 1 Application topically to rash on foot (two) times daily as needed. 11/07/22     risperiDONE (RISPERDAL) 0.5 MG tablet TAKE 1 TABLET(0.5 MG) BY MOUTH  TWICE DAILY 01/27/23   Cottle, Steva Ready., MD  tirzepatide [facility]) 2.5 MG/0.5ML Pen Inject 2.5 mg into the skin once a week. 09/25/22     tirzepatide (ZEPBOUND) 2.5 MG/0.5ML Pen Inject 2.5 mg into the skin once a week. 11/07/22     tirzepatide (ZEPBOUND) 5 MG/0.5ML Pen Inject 5 mg into the skin once a week. 02/24/23     tirzepatide (ZEPBOUND) 7.5 MG/0.5ML Pen Inject 7.5 mg into the skin once a week. 03/13/23     triamcinolone ointment (KENALOG) 0.1 % Apply 1 Application topically to rash on foot 2 (two) times daily as needed. 11/07/22         Allergies    Letrozole, Vanilla, and Zofran [ondansetron hcl]    Review of Systems   Review of Systems  Physical Exam Updated Vital Signs BP 121/75 (BP Location: Right Arm)   Pulse 99   Temp 98.1 F (36.7 C) (Oral)   Resp 16   Ht 5\' 6"  (1.676 m)   Wt 95.3 kg   LMP 04/10/2023 (Exact Date)   SpO2 100%   BMI 33.89 kg/m  Physical Exam Vitals and nursing note reviewed.  Constitutional:      General: She is not in acute distress.    Appearance: She is well-developed. She is not ill-appearing.  HENT:     Head: Normocephalic and atraumatic.     Nose: Nose  normal.     Mouth/Throat:     Mouth: Mucous membranes are moist.  Eyes:     Extraocular Movements: Extraocular movements intact.     Conjunctiva/sclera: Conjunctivae normal.     Pupils: Pupils are equal, round, and reactive to light.  Cardiovascular:     Rate and Rhythm: Normal rate and regular rhythm.     Pulses: Normal pulses.     Heart sounds: Normal heart sounds. No murmur heard. Pulmonary:     Effort: Pulmonary effort is normal. No respiratory distress.     Breath sounds: Normal breath sounds.  Abdominal:     Palpations: Abdomen is soft.     Tenderness: There is abdominal tenderness.  Musculoskeletal:        General: No swelling.     Cervical back: Normal range of motion and neck supple.  Skin:    General: Skin is warm and dry.     Capillary Refill: Capillary refill takes less  than 2 seconds.  Neurological:     Mental Status: She is alert.  Psychiatric:        Mood and Affect: Mood normal.     ED Results / Procedures / Treatments   Labs (all labs ordered are listed, but only abnormal results are displayed) Labs Reviewed  CBC WITH DIFFERENTIAL/PLATELET - Abnormal; Notable for the following components:      Result Value   RBC 5.57 (*)    MCV 79.4 (*)    MCH 25.9 (*)    All other components within normal limits  COMPREHENSIVE METABOLIC PANEL - Abnormal; Notable for the following components:   Potassium 3.3 (*)    Calcium 10.6 (*)    AST 14 (*)    All other components within normal limits  URINALYSIS, ROUTINE W REFLEX MICROSCOPIC - Abnormal; Notable for the following components:   Hgb urine dipstick LARGE (*)    Ketones, ur TRACE (*)    Bacteria, UA RARE (*)    All other components within normal limits  LIPASE, BLOOD  HCG, SERUM, QUALITATIVE    EKG None  Radiology CT ABDOMEN PELVIS W CONTRAST  Result Date: 04/13/2023 CLINICAL DATA:  History of endometriosis with pelvic cramping. EXAM: CT ABDOMEN AND PELVIS WITH CONTRAST TECHNIQUE: Multidetector CT imaging of the abdomen and pelvis was performed using the standard protocol following bolus administration of intravenous contrast. RADIATION DOSE REDUCTION: This exam was performed according to the departmental dose-optimization program which includes automated exposure control, adjustment of the mA and/or kV according to patient size and/or use of iterative reconstruction technique. CONTRAST:  OMNIPAQUE IOHEXOL 300 MG/ML  SOLN COMPARISON:  None Available. FINDINGS: Lower chest: No acute abnormality. Hepatobiliary: No focal liver abnormality is seen. No gallstones, gallbladder wall thickening, or biliary dilatation. Pancreas: Unremarkable. No pancreatic ductal dilatation or surrounding inflammatory changes. Spleen: Normal in size without focal abnormality. Adrenals/Urinary Tract: Adrenal glands are  unremarkable. Kidneys are normal in size, without renal calculi or hydronephrosis. Bilateral subcentimeter simple renal cysts are seen. Bladder is unremarkable. Stomach/Bowel: A mild amount of oral contrast is seen within the body of the stomach. Appendix appears normal. No evidence of bowel wall thickening, distention, or inflammatory changes. Vascular/Lymphatic: No significant vascular findings are present. No enlarged abdominal or pelvic lymph nodes. Reproductive: The uterus and left adnexa are unremarkable. An 11 mm diameter simple cyst is seen within the right adnexa. Other: No abdominal wall hernia or abnormality. No abdominopelvic ascites. Musculoskeletal: No acute or significant osseous findings. IMPRESSION: 1. 11 mm  diameter simple right ovarian cyst. No follow-up imaging is recommended. This recommendation follows ACR consensus guidelines: White Paper of the ACR Incidental Findings Committee II on Adnexal Findings. J Am Coll Radiol 2013:10:675-681. 2. Bilateral subcentimeter simple renal cysts. No follow-up imaging is recommended. This recommendation follows ACR consensus guidelines: Management of the Incidental Renal Mass on CT: A White Paper of the ACR Incidental Findings Committee. J Am Coll Radiol [phone]. Electronically Signed   By: Aram Candela M.D.   On: 04/13/2023 23:02    Procedures Procedures    Medications Ordered in ED Medications  oxyCODONE (Oxy IR/ROXICODONE) immediate release tablet 5 mg (has no administration in time range)  HYDROmorphone (DILAUDID) injection 1 mg (1 mg Intravenous Given 04/13/23 2153)  dicyclomine (BENTYL) capsule 20 mg (20 mg Oral Given 04/13/23 2152)  ondansetron (ZOFRAN) injection 4 mg (4 mg Intravenous Given 04/13/23 2153)  iohexol (OMNIPAQUE) 300 MG/ML solution 100 mL (100 mLs Intravenous Contrast Given 04/13/23 2252)    ED Course/ Medical Decision Making/ A&P                             Medical Decision Making Amount and/or Complexity of Data  Reviewed Labs: ordered. Radiology: ordered.  Risk Prescription drug management.   Jennifer Ho is here with abdominal pain and cramping.  She has history of endometriosis.  She is having really bad cramps as she is on day 3 of her menstrual cycle.  She is not currently on any birth control.  She supposed seeing endometriosis specialist.  She has been having fairly regular periods since having her children last year.  Normal vitals.  No fever.  Will get CT scan to evaluate for any other process I have very low suspicion for appendicitis or bowel obstruction or colitis.  Will give IV Dilaudid, check basic labs and work on symptomatic control.  Per my review interpretation labs no significant anemia or electrolyte abnormality or kidney injury or leukocytosis.  Pregnancy test negative.  CT scan unremarkable per radiology report.  She is feeling better.  Overall suspect bad menstrual cramps may be some endometriosis flare.  Will prescribe Roxicodone for breakthrough pain.  She can follow-up with her OB/GYN.  Discharged in good condition.  This chart was dictated using voice recognition software.  Despite best efforts to proofread,  errors can occur which can change the documentation meaning.         Final Clinical Impression(s) / ED Diagnoses Final diagnoses:  Abdominal pain, unspecified abdominal location    Rx / DC Orders ED Discharge Orders          Ordered    oxyCODONE (ROXICODONE) 5 MG immediate release tablet  Every 4 hours PRN        04/13/23 2240              Virgina Norfolk, DO 04/13/23 2308

## 2023-04-14 ENCOUNTER — Encounter: Payer: Self-pay | Admitting: Orthopedic Surgery

## 2023-04-14 ENCOUNTER — Ambulatory Visit: Payer: BC Managed Care – PPO | Admitting: Psychiatry
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
## 2023-04-16 DIAGNOSIS — F3341 Major depressive disorder, recurrent, in partial remission: Secondary | ICD-10-CM | POA: Diagnosis not present

## 2023-04-18 ENCOUNTER — Other Ambulatory Visit (HOSPITAL_COMMUNITY): Payer: Self-pay

## 2023-04-21 ENCOUNTER — Other Ambulatory Visit (HOSPITAL_COMMUNITY): Payer: Self-pay

## 2023-04-21 MED ORDER — ZEPBOUND 7.5 MG/0.5ML ~~LOC~~ SOAJ
7.5000 mg | SUBCUTANEOUS | 0 refills | Status: AC
  Filled 2023-04-21 – 2023-04-23 (×2): qty 2, 28d supply, fill #0

## 2023-04-23 ENCOUNTER — Telehealth: Payer: BC Managed Care – PPO | Admitting: Physician Assistant

## 2023-04-23 ENCOUNTER — Other Ambulatory Visit (HOSPITAL_COMMUNITY): Payer: Self-pay

## 2023-04-23 ENCOUNTER — Other Ambulatory Visit: Payer: Self-pay

## 2023-04-23 DIAGNOSIS — S39012A Strain of muscle, fascia and tendon of lower back, initial encounter: Secondary | ICD-10-CM | POA: Diagnosis not present

## 2023-04-23 MED ORDER — CYCLOBENZAPRINE HCL 10 MG PO TABS
5.0000 mg | ORAL_TABLET | Freq: Three times a day (TID) | ORAL | 0 refills | Status: AC | PRN
Start: 2023-04-23 — End: ?
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
## 2023-04-23 NOTE — Patient Instructions (Signed)
Jennifer Ho, thank you for joining Margaretann Loveless, PA-C for today's virtual visit.  While this provider is not your primary care provider (PCP), if your PCP is located in our provider database this encounter information will be shared with them immediately following your visit.   A [facility] MyChart account gives you access to today's visit and all your visits, tests, and labs performed at [facility] " click here if you don't have a [facility] MyChart account or go to [email or web address]  Consent: (Patient) Jennifer Ho provided verbal consent for this virtual visit at the beginning of the encounter.  Current Medications:  Current Outpatient Medications:    cyclobenzaprine (FLEXERIL) 10 MG tablet, Take 0.5-1 tablets (5-10 mg total) by mouth 3 (three) times daily as needed., Disp: 30 tablet, Rfl: 0   doxycycline (VIBRAMYCIN) 100 MG capsule, Take 1 capsule (100 mg total) by mouth 2 (two) times daily., Disp: 20 capsule, Rfl: 0   EPINEPHrine 0.3 mg/0.3 mL IJ SOAJ injection, , Disp: , Rfl:    ibuprofen (ADVIL) 600 MG tablet, Take by mouth as needed., Disp: , Rfl:    Lactase 9000 units TABS, Take by mouth., Disp: , Rfl:    lamoTRIgine (LAMICTAL) 200 MG tablet, Take 200 mg by mouth daily., Disp: , Rfl:    levothyroxine (SYNTHROID) 125 MCG tablet, Take 1 tablet by mouth daily., Disp: , Rfl:    lithium carbonate (ESKALITH) 450 MG ER tablet, Take 2 tablets (900 mg total) by mouth at bedtime., Disp: 60 tablet, Rfl: 3   nystatin ointment (MYCOSTATIN), Apply 1 Application topically to rash on foot (two) times daily as needed., Disp: 60 g, Rfl: 0   oxyCODONE (ROXICODONE) 5 MG immediate release tablet, Take 1 tablet (5 mg total) by mouth every 4 (four) hours as needed for up to 15 doses for severe pain., Disp: 15 tablet, Rfl: 0   risperiDONE (RISPERDAL) 0.5 MG tablet, TAKE 1 TABLET(0.5 MG) BY MOUTH TWICE DAILY, Disp: 180 tablet, Rfl: 1   tirzepatide (ZEPBOUND) 2.5  MG/0.5ML Pen, Inject 2.5 mg into the skin once a week., Disp: 2 mL, Rfl: 1   tirzepatide (ZEPBOUND) 2.5 MG/0.5ML Pen, Inject 2.5 mg into the skin once a week., Disp: 6 mL, Rfl: 0   tirzepatide (ZEPBOUND) 5 MG/0.5ML Pen, Inject 5 mg into the skin once a week., Disp: 6 mL, Rfl: 0   tirzepatide (ZEPBOUND) 7.5 MG/0.5ML Pen, Inject 7.5 mg into the skin once a week., Disp: 2 mL, Rfl: 0   triamcinolone ointment (KENALOG) 0.1 %, Apply 1 Application topically to rash on foot 2 (two) times daily as needed., Disp: 80 g, Rfl: 0   Medications ordered in this encounter:  Meds ordered this encounter  Medications   cyclobenzaprine (FLEXERIL) 10 MG tablet    Sig: Take 0.5-1 tablets (5-10 mg total) by mouth 3 (three) times daily as needed.    Dispense:  30 tablet    Refill:  0    Order Specific Question:   Supervising Provider    Answer:   Merrilee Jansky [id number]     *If you need refills on other medications prior to your next appointment, please contact your pharmacy*  Follow-Up: Call back or seek an in-person evaluation if the symptoms worsen or if the condition fails to improve as anticipated.  [facility] [phone]  Other Instructions  Lumbar Strain A lumbar strain, which is sometimes called a low-back strain, is a stretch or tear in a muscle  or tendons in the lower back (lumbar spine). Tendons are the strong cords of tissue that attach muscle to bone. This type of injury occurs when muscles or tendons are torn or are stretched beyond their limits. Lumbar strains can range from mild to severe. Mild strains may involve stretching a muscle or tendon without tearing it. These may heal in 1-2 weeks. More severe strains involve tearing of muscle fibers or tendons. These will cause more pain and may take 6-8 weeks to heal. What are the causes? This condition may be caused by: Trauma, such as a fall or a hit to the body. Twisting or overstretching the back. This may result from doing  activities that take a lot of energy, such as lifting heavy objects. What increases the risk? A lumbar strain is more common in: Athletes. People with obesity. People who do repeated lifting, bending, or other movements that involve their back. What are the signs or symptoms? Symptoms of this condition may include: Sharp or dull pain in the lower back that does not go away. The pain may spread to the buttocks. Stiffness or limited range of motion. Sudden muscle tightening (spasms). How is this diagnosed? This condition may be diagnosed based on: Your symptoms. Your medical history. A physical exam. Imaging tests, such as: X-rays. MRI. How is this treated? Treatment for this condition may include: Rest. Applying heat and cold to the affected area. Over-the-counter medicines to help with pain and inflammation, such as NSAIDs. Prescription medicine for pain or to relax the muscles. These may be needed for a short time. Physical therapy exercises to improve movement and strength. Follow these instructions at home: Managing pain, stiffness, and swelling     If told, put ice on the injured area during the first 24 hours after your injury. Put ice in a plastic bag. Place a towel between your skin and the bag. Leave the ice on for 20 minutes, 2-3 times a day. If told, apply heat to the affected area as often as told by your health care provider. Use the heat source that your health care provider recommends, such as a moist heat pack or a heating pad. Place a towel between your skin and the heat source. Leave the heat on for 20-30 minutes. If your skin turns bright red, remove the ice or heat right away to prevent skin damage. The risk of damage is higher if you cannot feel pain, heat, or cold. Activity Rest and return to your normal activities as told by your health care provider. Ask your health care provider what activities are safe for you. Do exercises as told by your health care  provider. General instructions Take over-the-counter and prescription medicines only as told by your health care provider. Ask your health care provider if the medicine prescribed to you: Requires you to avoid driving or using machinery. Can cause constipation. You may need to take these actions to prevent or treat constipation: Drink enough fluid to keep your urine pale yellow. Take over-the-counter or prescription medicines. Eat foods that are high in fiber, such as beans, whole grains, and fresh fruits and vegetables. Limit foods that are high in fat and processed sugars, such as fried or sweet foods. Do not use any products that contain nicotine or tobacco. These products include cigarettes, chewing tobacco, and vaping devices, such as e-cigarettes. If you need help quitting, ask your health care provider. How is this prevented? To prevent a future low-back injury: Always warm up properly before physical  activity or sports. Cool down and stretch after being active. Use correct form when playing sports and lifting heavy objects. Bend your knees before you lift heavy objects. Use good posture when sitting and standing. Stay physically fit and keep a healthy weight. Do at least 150 minutes of moderate intensity exercise each week, such as brisk walking or water aerobics. Do strength exercises at least 2 times each week. Contact a health care provider if: Your back pain does not improve after several weeks of treatment. Your symptoms get worse. You have a fever. Get help right away if: Your back pain is severe. You are unable to stand or walk. You develop pain in your legs. You have weakness in your buttocks or legs. You have trouble controlling when you urinate or when you have a bowel movement. You have frequent, painful, or bloody urination. This information is not intended to replace advice given to you by your health care provider. Make sure you discuss any questions you have with  your health care provider. Document Revised: 04/16/2022 Document Reviewed: 04/16/2022 Elsevier Patient Education  2024 [employer]   Back Exercises The following exercises strengthen the muscles that help to support the trunk (torso) and back. They also help to keep the lower back flexible. Doing these exercises can help to prevent or lessen existing low back pain. If you have back pain or discomfort, try doing these exercises 2-3 times each day or as told by your health care provider. As your pain improves, do them once each day, but increase the number of times that you repeat the steps for each exercise (do more repetitions). To prevent the recurrence of back pain, continue to do these exercises once each day or as told by your health care provider. Do exercises exactly as told by your health care provider and adjust them as directed. It is normal to feel mild stretching, pulling, tightness, or discomfort as you do these exercises, but you should stop right away if you feel sudden pain or your pain gets worse. Exercises Single knee to chest Repeat these steps 3-5 times for each leg: Lie on your back on a firm bed or the floor with your legs extended.   If you have been instructed to have an in-person evaluation today at a local Urgent Care facility, please use the link below. It will take you to a list of all of our available [facility], including address, phone number and hours of operation. Please do not delay care.  [facility]  If you or a family member do not have a primary care provider, use the link below to schedule a visit and establish care. When you choose a [facility] primary care physician or advanced practice provider, you gain a long-term partner in health. Find a Primary Care Provider  Learn more about [facility]'s in-office and virtual care options: [facility] Get Care Now

## 2023-04-24 ENCOUNTER — Ambulatory Visit (HOSPITAL_COMMUNITY): Payer: BC Managed Care – PPO

## 2023-04-29 ENCOUNTER — Other Ambulatory Visit (HOSPITAL_COMMUNITY): Payer: Self-pay

## 2023-04-29 DIAGNOSIS — R7303 Prediabetes: Secondary | ICD-10-CM | POA: Diagnosis not present

## 2023-04-29 DIAGNOSIS — E039 Hypothyroidism, unspecified: Secondary | ICD-10-CM | POA: Diagnosis not present

## 2023-04-29 DIAGNOSIS — R110 Nausea: Secondary | ICD-10-CM | POA: Diagnosis not present

## 2023-04-29 DIAGNOSIS — K219 Gastro-esophageal reflux disease without esophagitis: Secondary | ICD-10-CM | POA: Diagnosis not present

## 2023-04-29 MED ORDER — ZEPBOUND 10 MG/0.5ML ~~LOC~~ SOAJ
10.0000 mg | SUBCUTANEOUS | 0 refills | Status: AC
  Filled 2023-04-29: qty 6, 84d supply, fill #0

## 2023-05-07 DIAGNOSIS — R300 Dysuria: Secondary | ICD-10-CM | POA: Diagnosis not present

## 2023-05-20 DIAGNOSIS — F3341 Major depressive disorder, recurrent, in partial remission: Secondary | ICD-10-CM | POA: Diagnosis not present

## 2023-05-25 ENCOUNTER — Other Ambulatory Visit: Payer: Self-pay | Admitting: Psychiatry

## 2023-05-25 DIAGNOSIS — F339 Major depressive disorder, recurrent, unspecified: Secondary | ICD-10-CM

## 2023-05-25 DIAGNOSIS — R45851 Suicidal ideations: Secondary | ICD-10-CM

## 2023-05-25 NOTE — Telephone Encounter (Signed)
Sent MyChart message, was due in June.

## 2023-05-26 ENCOUNTER — Ambulatory Visit: Payer: BC Managed Care – PPO

## 2023-05-27 ENCOUNTER — Encounter: Payer: Self-pay | Admitting: Psychiatry

## 2023-05-27 ENCOUNTER — Ambulatory Visit: Payer: BC Managed Care – PPO | Admitting: Psychiatry

## 2023-05-27 DIAGNOSIS — M797 Fibromyalgia: Secondary | ICD-10-CM

## 2023-05-27 DIAGNOSIS — F900 Attention-deficit hyperactivity disorder, predominantly inattentive type: Secondary | ICD-10-CM | POA: Diagnosis not present

## 2023-05-27 DIAGNOSIS — F331 Major depressive disorder, recurrent, moderate: Secondary | ICD-10-CM

## 2023-05-27 DIAGNOSIS — F4310 Post-traumatic stress disorder, unspecified: Secondary | ICD-10-CM | POA: Diagnosis not present

## 2023-05-27 DIAGNOSIS — F5105 Insomnia due to other mental disorder: Secondary | ICD-10-CM

## 2023-05-27 MED ORDER — SUBVENITE 200 MG PO TABS
200.0000 mg | ORAL_TABLET | Freq: Two times a day (BID) | ORAL | 1 refills | Status: DC
Start: 2023-05-27 — End: 2023-12-03

## 2023-05-27 MED ORDER — RISPERIDONE 0.5 MG PO TABS: ORAL_TABLET | ORAL | 1 refills | Status: DC

## 2023-05-27 NOTE — Telephone Encounter (Signed)
Has appt today
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
## 2023-05-29 ENCOUNTER — Other Ambulatory Visit (HOSPITAL_COMMUNITY): Payer: Self-pay

## 2023-05-29 MED ORDER — ZEPBOUND 15 MG/0.5ML ~~LOC~~ SOAJ
15.0000 mg | SUBCUTANEOUS | 0 refills | Status: AC
  Filled 2023-05-29: qty 6, 84d supply, fill #0
  Filled 2023-06-24: qty 2, 28d supply, fill #0

## 2023-06-02 ENCOUNTER — Other Ambulatory Visit (HOSPITAL_COMMUNITY): Payer: Self-pay

## 2023-06-02 MED ORDER — ZEPBOUND 15 MG/0.5ML ~~LOC~~ SOAJ
15.0000 mg | SUBCUTANEOUS | 0 refills | Status: AC
  Filled 2023-06-02 – 2023-07-18 (×2): qty 6, 84d supply, fill #0

## 2023-06-05 DIAGNOSIS — F3341 Major depressive disorder, recurrent, in partial remission: Secondary | ICD-10-CM | POA: Diagnosis not present

## 2023-06-24 ENCOUNTER — Other Ambulatory Visit (HOSPITAL_COMMUNITY): Payer: Self-pay

## 2023-06-24 DIAGNOSIS — E039 Hypothyroidism, unspecified: Secondary | ICD-10-CM | POA: Diagnosis not present

## 2023-06-24 DIAGNOSIS — R031 Nonspecific low blood-pressure reading: Secondary | ICD-10-CM | POA: Diagnosis not present

## 2023-06-24 DIAGNOSIS — R7303 Prediabetes: Secondary | ICD-10-CM | POA: Diagnosis not present

## 2023-06-24 DIAGNOSIS — E669 Obesity, unspecified: Secondary | ICD-10-CM | POA: Diagnosis not present

## 2023-06-25 DIAGNOSIS — K580 Irritable bowel syndrome with diarrhea: Secondary | ICD-10-CM | POA: Diagnosis not present

## 2023-07-02 DIAGNOSIS — E038 Other specified hypothyroidism: Secondary | ICD-10-CM | POA: Diagnosis not present

## 2023-07-02 DIAGNOSIS — Z8632 Personal history of gestational diabetes: Secondary | ICD-10-CM | POA: Diagnosis not present

## 2023-07-02 DIAGNOSIS — E063 Autoimmune thyroiditis: Secondary | ICD-10-CM | POA: Diagnosis not present

## 2023-07-03 DIAGNOSIS — F3341 Major depressive disorder, recurrent, in partial remission: Secondary | ICD-10-CM | POA: Diagnosis not present

## 2023-07-07 DIAGNOSIS — E063 Autoimmune thyroiditis: Secondary | ICD-10-CM | POA: Diagnosis not present

## 2023-07-07 DIAGNOSIS — E038 Other specified hypothyroidism: Secondary | ICD-10-CM | POA: Diagnosis not present

## 2023-07-17 DIAGNOSIS — F3341 Major depressive disorder, recurrent, in partial remission: Secondary | ICD-10-CM | POA: Diagnosis not present

## 2023-07-18 ENCOUNTER — Other Ambulatory Visit (HOSPITAL_COMMUNITY): Payer: Self-pay

## 2023-07-29 DIAGNOSIS — N907 Vulvar cyst: Secondary | ICD-10-CM | POA: Diagnosis not present

## 2023-07-31 ENCOUNTER — Ambulatory Visit: Payer: BC Managed Care – PPO

## 2023-07-31 DIAGNOSIS — R0789 Other chest pain: Secondary | ICD-10-CM | POA: Diagnosis not present

## 2023-07-31 DIAGNOSIS — L853 Xerosis cutis: Secondary | ICD-10-CM | POA: Diagnosis not present

## 2023-07-31 DIAGNOSIS — I10 Essential (primary) hypertension: Secondary | ICD-10-CM | POA: Diagnosis not present

## 2023-07-31 DIAGNOSIS — R110 Nausea: Secondary | ICD-10-CM | POA: Diagnosis not present

## 2023-07-31 DIAGNOSIS — I4510 Unspecified right bundle-branch block: Secondary | ICD-10-CM | POA: Diagnosis not present

## 2023-07-31 DIAGNOSIS — R079 Chest pain, unspecified: Secondary | ICD-10-CM | POA: Diagnosis not present

## 2023-07-31 DIAGNOSIS — L603 Nail dystrophy: Secondary | ICD-10-CM | POA: Diagnosis not present

## 2023-07-31 DIAGNOSIS — K760 Fatty (change of) liver, not elsewhere classified: Secondary | ICD-10-CM | POA: Diagnosis not present

## 2023-07-31 DIAGNOSIS — M79676 Pain in unspecified toe(s): Secondary | ICD-10-CM | POA: Diagnosis not present

## 2023-07-31 DIAGNOSIS — E785 Hyperlipidemia, unspecified: Secondary | ICD-10-CM | POA: Diagnosis not present

## 2023-07-31 DIAGNOSIS — M2041 Other hammer toe(s) (acquired), right foot: Secondary | ICD-10-CM | POA: Diagnosis not present

## 2023-08-01 DIAGNOSIS — I4510 Unspecified right bundle-branch block: Secondary | ICD-10-CM | POA: Diagnosis not present

## 2023-08-04 DIAGNOSIS — F3341 Major depressive disorder, recurrent, in partial remission: Secondary | ICD-10-CM | POA: Diagnosis not present

## 2023-08-05 DIAGNOSIS — E063 Autoimmune thyroiditis: Secondary | ICD-10-CM | POA: Diagnosis not present

## 2023-08-13 DIAGNOSIS — L600 Ingrowing nail: Secondary | ICD-10-CM | POA: Diagnosis not present

## 2023-08-14 DIAGNOSIS — F3341 Major depressive disorder, recurrent, in partial remission: Secondary | ICD-10-CM | POA: Diagnosis not present

## 2023-08-19 DIAGNOSIS — R198 Other specified symptoms and signs involving the digestive system and abdomen: Secondary | ICD-10-CM | POA: Diagnosis not present

## 2023-08-19 DIAGNOSIS — Z48817 Encounter for surgical aftercare following surgery on the skin and subcutaneous tissue: Secondary | ICD-10-CM | POA: Diagnosis not present

## 2023-08-19 DIAGNOSIS — E66811 Obesity, class 1: Secondary | ICD-10-CM | POA: Diagnosis not present

## 2023-08-19 DIAGNOSIS — R7303 Prediabetes: Secondary | ICD-10-CM | POA: Diagnosis not present

## 2023-08-19 DIAGNOSIS — E039 Hypothyroidism, unspecified: Secondary | ICD-10-CM | POA: Diagnosis not present

## 2023-08-19 DIAGNOSIS — G479 Sleep disorder, unspecified: Secondary | ICD-10-CM | POA: Diagnosis not present

## 2023-08-21 DIAGNOSIS — Z3009 Encounter for other general counseling and advice on contraception: Secondary | ICD-10-CM | POA: Diagnosis not present

## 2023-08-21 DIAGNOSIS — N809 Endometriosis, unspecified: Secondary | ICD-10-CM | POA: Diagnosis not present

## 2023-08-27 ENCOUNTER — Encounter: Payer: Self-pay | Admitting: Psychiatry

## 2023-08-27 ENCOUNTER — Telehealth (INDEPENDENT_AMBULATORY_CARE_PROVIDER_SITE_OTHER): Payer: BC Managed Care – PPO | Admitting: Psychiatry

## 2023-08-27 DIAGNOSIS — F5105 Insomnia due to other mental disorder: Secondary | ICD-10-CM

## 2023-08-27 DIAGNOSIS — F900 Attention-deficit hyperactivity disorder, predominantly inattentive type: Secondary | ICD-10-CM | POA: Diagnosis not present

## 2023-08-27 DIAGNOSIS — F331 Major depressive disorder, recurrent, moderate: Secondary | ICD-10-CM | POA: Diagnosis not present

## 2023-08-27 DIAGNOSIS — M797 Fibromyalgia: Secondary | ICD-10-CM

## 2023-08-27 DIAGNOSIS — F4310 Post-traumatic stress disorder, unspecified: Secondary | ICD-10-CM | POA: Diagnosis not present

## 2023-08-27 MED ORDER — SERTRALINE HCL 50 MG PO TABS
ORAL_TABLET | ORAL | 0 refills | Status: DC
Start: 2023-08-27 — End: 2023-09-12
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
## 2023-08-28 DIAGNOSIS — F3341 Major depressive disorder, recurrent, in partial remission: Secondary | ICD-10-CM | POA: Diagnosis not present

## 2023-08-29 DIAGNOSIS — J069 Acute upper respiratory infection, unspecified: Secondary | ICD-10-CM | POA: Diagnosis not present

## 2023-09-03 DIAGNOSIS — Z01812 Encounter for preprocedural laboratory examination: Secondary | ICD-10-CM | POA: Diagnosis not present

## 2023-09-03 DIAGNOSIS — Z309 Encounter for contraceptive management, unspecified: Secondary | ICD-10-CM | POA: Diagnosis not present

## 2023-09-03 DIAGNOSIS — Z3009 Encounter for other general counseling and advice on contraception: Secondary | ICD-10-CM | POA: Diagnosis not present

## 2023-09-12 ENCOUNTER — Telehealth: Payer: Self-pay | Admitting: Psychiatry

## 2023-09-12 ENCOUNTER — Other Ambulatory Visit: Payer: Self-pay | Admitting: Psychiatry

## 2023-09-12 MED ORDER — ESCITALOPRAM OXALATE 10 MG PO TABS
10.0000 mg | ORAL_TABLET | Freq: Every day | ORAL | 0 refills | Status: DC
Start: 2023-09-12 — End: 2023-10-07

## 2023-09-12 NOTE — Telephone Encounter (Signed)
Pt lvm that the new medicine that dr. Jennelle Human prescribes last week is not working. She said it is upsettting her stomach. She would like a different medicine. Please call her at [phone]

## 2023-09-12 NOTE — Telephone Encounter (Signed)
Patient reporting medication prescribed last visit is upsetting her stomach and she would like to try something else. It looks like it is sertraline.   Risperidone 0.5 mg BID  Continue lithium 450 which helped anger Continue subvenite 200 mg daily. RetrialSSRI for anxiety, sertraline 50.  Call if SE.  If it works try to stop risperidone.

## 2023-09-12 NOTE — Telephone Encounter (Signed)
This can wait until Monday:  DC sertraline .  We will use Lexapro 10 mg instead.  Less likely to upset stomach but otherwise is similar so she can abruptly switch.  I will send in RX

## 2023-09-20 ENCOUNTER — Other Ambulatory Visit: Payer: Self-pay | Admitting: Psychiatry

## 2023-09-22 DIAGNOSIS — F3341 Major depressive disorder, recurrent, in partial remission: Secondary | ICD-10-CM | POA: Diagnosis not present

## 2023-10-06 ENCOUNTER — Other Ambulatory Visit: Payer: Self-pay | Admitting: Psychiatry

## 2023-10-20 DIAGNOSIS — K439 Ventral hernia without obstruction or gangrene: Secondary | ICD-10-CM | POA: Diagnosis not present

## 2023-10-20 DIAGNOSIS — R110 Nausea: Secondary | ICD-10-CM | POA: Diagnosis not present

## 2023-10-20 DIAGNOSIS — M2550 Pain in unspecified joint: Secondary | ICD-10-CM | POA: Diagnosis not present

## 2023-10-21 DIAGNOSIS — E063 Autoimmune thyroiditis: Secondary | ICD-10-CM | POA: Diagnosis not present

## 2023-10-21 DIAGNOSIS — M2550 Pain in unspecified joint: Secondary | ICD-10-CM | POA: Diagnosis not present

## 2023-10-23 DIAGNOSIS — K5900 Constipation, unspecified: Secondary | ICD-10-CM | POA: Diagnosis not present

## 2023-10-23 DIAGNOSIS — R1031 Right lower quadrant pain: Secondary | ICD-10-CM | POA: Diagnosis not present

## 2023-10-23 DIAGNOSIS — R933 Abnormal findings on diagnostic imaging of other parts of digestive tract: Secondary | ICD-10-CM | POA: Diagnosis not present

## 2023-10-25 DIAGNOSIS — I4510 Unspecified right bundle-branch block: Secondary | ICD-10-CM | POA: Diagnosis not present

## 2023-10-25 DIAGNOSIS — I517 Cardiomegaly: Secondary | ICD-10-CM | POA: Diagnosis not present

## 2023-10-25 DIAGNOSIS — I498 Other specified cardiac arrhythmias: Secondary | ICD-10-CM | POA: Diagnosis not present

## 2023-11-03 DIAGNOSIS — E039 Hypothyroidism, unspecified: Secondary | ICD-10-CM | POA: Diagnosis not present

## 2023-11-03 DIAGNOSIS — I517 Cardiomegaly: Secondary | ICD-10-CM | POA: Diagnosis not present

## 2023-11-03 DIAGNOSIS — R9389 Abnormal findings on diagnostic imaging of other specified body structures: Secondary | ICD-10-CM | POA: Diagnosis not present

## 2023-11-06 DIAGNOSIS — F3341 Major depressive disorder, recurrent, in partial remission: Secondary | ICD-10-CM | POA: Diagnosis not present

## 2023-11-19 DIAGNOSIS — S3992XS Unspecified injury of lower back, sequela: Secondary | ICD-10-CM | POA: Diagnosis not present

## 2023-11-19 DIAGNOSIS — M79671 Pain in right foot: Secondary | ICD-10-CM | POA: Diagnosis not present

## 2023-11-19 DIAGNOSIS — M533 Sacrococcygeal disorders, not elsewhere classified: Secondary | ICD-10-CM | POA: Diagnosis not present

## 2023-11-19 DIAGNOSIS — S92514A Nondisplaced fracture of proximal phalanx of right lesser toe(s), initial encounter for closed fracture: Secondary | ICD-10-CM | POA: Diagnosis not present

## 2023-11-19 DIAGNOSIS — Z79899 Other long term (current) drug therapy: Secondary | ICD-10-CM | POA: Diagnosis not present

## 2023-11-20 DIAGNOSIS — F3341 Major depressive disorder, recurrent, in partial remission: Secondary | ICD-10-CM | POA: Diagnosis not present

## 2023-11-21 ENCOUNTER — Other Ambulatory Visit: Payer: Self-pay | Admitting: Psychiatry

## 2023-11-25 DIAGNOSIS — M79671 Pain in right foot: Secondary | ICD-10-CM | POA: Diagnosis not present

## 2023-12-03 ENCOUNTER — Encounter: Payer: Self-pay | Admitting: Psychiatry

## 2023-12-03 ENCOUNTER — Telehealth: Payer: BC Managed Care – PPO | Admitting: Psychiatry

## 2023-12-03 DIAGNOSIS — F331 Major depressive disorder, recurrent, moderate: Secondary | ICD-10-CM | POA: Diagnosis not present

## 2023-12-03 DIAGNOSIS — F5105 Insomnia due to other mental disorder: Secondary | ICD-10-CM | POA: Diagnosis not present

## 2023-12-03 DIAGNOSIS — F900 Attention-deficit hyperactivity disorder, predominantly inattentive type: Secondary | ICD-10-CM

## 2023-12-03 DIAGNOSIS — F4310 Post-traumatic stress disorder, unspecified: Secondary | ICD-10-CM | POA: Diagnosis not present

## 2023-12-03 DIAGNOSIS — F99 Mental disorder, not otherwise specified: Secondary | ICD-10-CM

## 2023-12-03 DIAGNOSIS — M797 Fibromyalgia: Secondary | ICD-10-CM

## 2023-12-03 MED ORDER — ESCITALOPRAM OXALATE 5 MG PO TABS: 15.0000 mg | ORAL_TABLET | Freq: Every day | ORAL | 0 refills | Status: DC

## 2023-12-03 MED ORDER — RISPERIDONE 1 MG PO TABS
1.0000 mg | ORAL_TABLET | Freq: Every day | ORAL | 0 refills | Status: DC
Start: 2023-12-03 — End: 2024-02-16

## 2023-12-03 MED ORDER — SUBVENITE 200 MG PO TABS: 200.0000 mg | ORAL_TABLET | Freq: Two times a day (BID) | ORAL | 1 refills | Status: DC
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
## 2023-12-04 DIAGNOSIS — F3341 Major depressive disorder, recurrent, in partial remission: Secondary | ICD-10-CM | POA: Diagnosis not present

## 2023-12-09 DIAGNOSIS — E039 Hypothyroidism, unspecified: Secondary | ICD-10-CM | POA: Diagnosis not present

## 2023-12-09 DIAGNOSIS — F5081 Binge eating disorder, mild: Secondary | ICD-10-CM | POA: Diagnosis not present

## 2023-12-09 DIAGNOSIS — R7303 Prediabetes: Secondary | ICD-10-CM | POA: Diagnosis not present

## 2023-12-09 DIAGNOSIS — F419 Anxiety disorder, unspecified: Secondary | ICD-10-CM | POA: Diagnosis not present

## 2023-12-11 DIAGNOSIS — F3341 Major depressive disorder, recurrent, in partial remission: Secondary | ICD-10-CM | POA: Diagnosis not present

## 2023-12-22 ENCOUNTER — Other Ambulatory Visit: Payer: Self-pay | Admitting: Psychiatry

## 2023-12-22 NOTE — Telephone Encounter (Signed)
 Is she taking 450 mg or 900 mg?

## 2023-12-25 DIAGNOSIS — M79671 Pain in right foot: Secondary | ICD-10-CM | POA: Diagnosis not present

## 2023-12-30 DIAGNOSIS — M79671 Pain in right foot: Secondary | ICD-10-CM | POA: Diagnosis not present

## 2023-12-31 DIAGNOSIS — M79671 Pain in right foot: Secondary | ICD-10-CM | POA: Diagnosis not present

## 2024-01-01 DIAGNOSIS — F3341 Major depressive disorder, recurrent, in partial remission: Secondary | ICD-10-CM | POA: Diagnosis not present

## 2024-01-06 DIAGNOSIS — K529 Noninfective gastroenteritis and colitis, unspecified: Secondary | ICD-10-CM | POA: Diagnosis not present

## 2024-01-14 DIAGNOSIS — E063 Autoimmune thyroiditis: Secondary | ICD-10-CM | POA: Diagnosis not present

## 2024-01-14 DIAGNOSIS — Z8632 Personal history of gestational diabetes: Secondary | ICD-10-CM | POA: Diagnosis not present

## 2024-01-23 DIAGNOSIS — B352 Tinea manuum: Secondary | ICD-10-CM | POA: Diagnosis not present

## 2024-01-23 DIAGNOSIS — B353 Tinea pedis: Secondary | ICD-10-CM | POA: Diagnosis not present

## 2024-01-23 DIAGNOSIS — L84 Corns and callosities: Secondary | ICD-10-CM | POA: Diagnosis not present

## 2024-01-23 DIAGNOSIS — B351 Tinea unguium: Secondary | ICD-10-CM | POA: Diagnosis not present

## 2024-01-27 DIAGNOSIS — M79671 Pain in right foot: Secondary | ICD-10-CM | POA: Diagnosis not present

## 2024-01-29 DIAGNOSIS — F3341 Major depressive disorder, recurrent, in partial remission: Secondary | ICD-10-CM | POA: Diagnosis not present

## 2024-02-12 DIAGNOSIS — F3341 Major depressive disorder, recurrent, in partial remission: Secondary | ICD-10-CM | POA: Diagnosis not present

## 2024-02-16 ENCOUNTER — Telehealth: Payer: BC Managed Care – PPO | Admitting: Psychiatry

## 2024-02-16 ENCOUNTER — Encounter: Payer: Self-pay | Admitting: Psychiatry

## 2024-02-16 DIAGNOSIS — F4310 Post-traumatic stress disorder, unspecified: Secondary | ICD-10-CM

## 2024-02-16 DIAGNOSIS — F900 Attention-deficit hyperactivity disorder, predominantly inattentive type: Secondary | ICD-10-CM

## 2024-02-16 DIAGNOSIS — F5105 Insomnia due to other mental disorder: Secondary | ICD-10-CM

## 2024-02-16 DIAGNOSIS — M797 Fibromyalgia: Secondary | ICD-10-CM

## 2024-02-16 DIAGNOSIS — F331 Major depressive disorder, recurrent, moderate: Secondary | ICD-10-CM

## 2024-02-16 MED ORDER — LAMOTRIGINE ER 200 MG PO TB24: 200.0000 mg | ORAL_TABLET | Freq: Every day | ORAL | 1 refills | Status: AC

## 2024-02-16 MED ORDER — RISPERIDONE 1 MG PO TABS: 1.0000 mg | ORAL_TABLET | Freq: Every day | ORAL | 0 refills | Status: DC

## 2024-02-16 MED ORDER — LITHIUM CARBONATE ER 450 MG PO TBCR: 450.0000 mg | EXTENDED_RELEASE_TABLET | Freq: Every day | ORAL | 1 refills | Status: AC
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
## 2024-02-17 DIAGNOSIS — K439 Ventral hernia without obstruction or gangrene: Secondary | ICD-10-CM | POA: Diagnosis not present

## 2024-02-17 DIAGNOSIS — F419 Anxiety disorder, unspecified: Secondary | ICD-10-CM | POA: Diagnosis not present

## 2024-02-17 DIAGNOSIS — Z0000 Encounter for general adult medical examination without abnormal findings: Secondary | ICD-10-CM | POA: Diagnosis not present

## 2024-02-17 DIAGNOSIS — Z13220 Encounter for screening for lipoid disorders: Secondary | ICD-10-CM | POA: Diagnosis not present

## 2024-02-17 DIAGNOSIS — E063 Autoimmune thyroiditis: Secondary | ICD-10-CM | POA: Diagnosis not present

## 2024-02-17 DIAGNOSIS — K591 Functional diarrhea: Secondary | ICD-10-CM | POA: Diagnosis not present

## 2024-02-20 DIAGNOSIS — K439 Ventral hernia without obstruction or gangrene: Secondary | ICD-10-CM | POA: Diagnosis not present

## 2024-02-25 DIAGNOSIS — L308 Other specified dermatitis: Secondary | ICD-10-CM | POA: Diagnosis not present

## 2024-02-25 DIAGNOSIS — L219 Seborrheic dermatitis, unspecified: Secondary | ICD-10-CM | POA: Diagnosis not present

## 2024-02-25 DIAGNOSIS — B379 Candidiasis, unspecified: Secondary | ICD-10-CM | POA: Diagnosis not present

## 2024-02-26 DIAGNOSIS — F3341 Major depressive disorder, recurrent, in partial remission: Secondary | ICD-10-CM | POA: Diagnosis not present

## 2024-03-01 ENCOUNTER — Other Ambulatory Visit: Payer: Self-pay | Admitting: Psychiatry

## 2024-03-01 DIAGNOSIS — F4310 Post-traumatic stress disorder, unspecified: Secondary | ICD-10-CM

## 2024-03-01 DIAGNOSIS — F331 Major depressive disorder, recurrent, moderate: Secondary | ICD-10-CM

## 2024-03-05 DIAGNOSIS — R21 Rash and other nonspecific skin eruption: Secondary | ICD-10-CM | POA: Diagnosis not present

## 2024-03-08 DIAGNOSIS — M79671 Pain in right foot: Secondary | ICD-10-CM | POA: Diagnosis not present

## 2024-03-08 DIAGNOSIS — M722 Plantar fascial fibromatosis: Secondary | ICD-10-CM | POA: Diagnosis not present

## 2024-03-08 DIAGNOSIS — M79672 Pain in left foot: Secondary | ICD-10-CM | POA: Diagnosis not present

## 2024-03-08 DIAGNOSIS — G5752 Tarsal tunnel syndrome, left lower limb: Secondary | ICD-10-CM | POA: Diagnosis not present

## 2024-03-09 DIAGNOSIS — Z01419 Encounter for gynecological examination (general) (routine) without abnormal findings: Secondary | ICD-10-CM | POA: Diagnosis not present

## 2024-03-09 DIAGNOSIS — Z124 Encounter for screening for malignant neoplasm of cervix: Secondary | ICD-10-CM | POA: Diagnosis not present

## 2024-03-13 DIAGNOSIS — R5381 Other malaise: Secondary | ICD-10-CM | POA: Diagnosis not present

## 2024-03-13 DIAGNOSIS — R55 Syncope and collapse: Secondary | ICD-10-CM | POA: Diagnosis not present

## 2024-03-13 DIAGNOSIS — I959 Hypotension, unspecified: Secondary | ICD-10-CM | POA: Diagnosis not present

## 2024-03-14 DIAGNOSIS — R531 Weakness: Secondary | ICD-10-CM | POA: Diagnosis not present

## 2024-03-14 DIAGNOSIS — E860 Dehydration: Secondary | ICD-10-CM | POA: Diagnosis not present

## 2024-03-14 DIAGNOSIS — R42 Dizziness and giddiness: Secondary | ICD-10-CM | POA: Diagnosis not present

## 2024-03-14 DIAGNOSIS — I959 Hypotension, unspecified: Secondary | ICD-10-CM | POA: Diagnosis not present

## 2024-03-14 DIAGNOSIS — R519 Headache, unspecified: Secondary | ICD-10-CM | POA: Diagnosis not present

## 2024-03-14 DIAGNOSIS — R001 Bradycardia, unspecified: Secondary | ICD-10-CM | POA: Diagnosis not present

## 2024-03-18 DIAGNOSIS — R001 Bradycardia, unspecified: Secondary | ICD-10-CM | POA: Diagnosis not present

## 2024-03-18 DIAGNOSIS — R55 Syncope and collapse: Secondary | ICD-10-CM | POA: Diagnosis not present

## 2024-03-24 DIAGNOSIS — G5752 Tarsal tunnel syndrome, left lower limb: Secondary | ICD-10-CM | POA: Diagnosis not present

## 2024-03-24 DIAGNOSIS — M79672 Pain in left foot: Secondary | ICD-10-CM | POA: Diagnosis not present

## 2024-03-25 DIAGNOSIS — F3341 Major depressive disorder, recurrent, in partial remission: Secondary | ICD-10-CM | POA: Diagnosis not present

## 2024-04-05 DIAGNOSIS — I950 Idiopathic hypotension: Secondary | ICD-10-CM | POA: Diagnosis not present

## 2024-04-13 DIAGNOSIS — Z8639 Personal history of other endocrine, nutritional and metabolic disease: Secondary | ICD-10-CM | POA: Diagnosis not present

## 2024-04-13 DIAGNOSIS — K219 Gastro-esophageal reflux disease without esophagitis: Secondary | ICD-10-CM | POA: Diagnosis not present

## 2024-04-13 DIAGNOSIS — F5081 Binge eating disorder, mild: Secondary | ICD-10-CM | POA: Diagnosis not present

## 2024-04-13 DIAGNOSIS — E663 Overweight: Secondary | ICD-10-CM | POA: Diagnosis not present

## 2024-04-14 DIAGNOSIS — F3341 Major depressive disorder, recurrent, in partial remission: Secondary | ICD-10-CM | POA: Diagnosis not present

## 2024-04-20 DIAGNOSIS — I950 Idiopathic hypotension: Secondary | ICD-10-CM | POA: Diagnosis not present

## 2024-04-20 DIAGNOSIS — Z13220 Encounter for screening for lipoid disorders: Secondary | ICD-10-CM | POA: Diagnosis not present

## 2024-04-20 DIAGNOSIS — K439 Ventral hernia without obstruction or gangrene: Secondary | ICD-10-CM | POA: Diagnosis not present

## 2024-04-23 DIAGNOSIS — K5900 Constipation, unspecified: Secondary | ICD-10-CM | POA: Diagnosis not present

## 2024-04-27 ENCOUNTER — Telehealth: Admitting: Psychiatry

## 2024-04-27 DIAGNOSIS — F3341 Major depressive disorder, recurrent, in partial remission: Secondary | ICD-10-CM | POA: Diagnosis not present

## 2024-04-28 DIAGNOSIS — Z1330 Encounter for screening examination for mental health and behavioral disorders, unspecified: Secondary | ICD-10-CM | POA: Diagnosis not present

## 2024-04-28 DIAGNOSIS — K439 Ventral hernia without obstruction or gangrene: Secondary | ICD-10-CM | POA: Diagnosis not present

## 2024-05-01 ENCOUNTER — Other Ambulatory Visit: Payer: Self-pay | Admitting: Psychiatry

## 2024-05-01 DIAGNOSIS — F4310 Post-traumatic stress disorder, unspecified: Secondary | ICD-10-CM

## 2024-05-01 DIAGNOSIS — F331 Major depressive disorder, recurrent, moderate: Secondary | ICD-10-CM

## 2024-05-06 DIAGNOSIS — R7989 Other specified abnormal findings of blood chemistry: Secondary | ICD-10-CM | POA: Diagnosis not present

## 2024-05-06 DIAGNOSIS — E063 Autoimmune thyroiditis: Secondary | ICD-10-CM | POA: Diagnosis not present

## 2024-05-11 ENCOUNTER — Telehealth: Payer: Self-pay | Admitting: Psychiatry

## 2024-05-11 ENCOUNTER — Other Ambulatory Visit: Payer: Self-pay | Admitting: Psychiatry

## 2024-05-11 DIAGNOSIS — F3341 Major depressive disorder, recurrent, in partial remission: Secondary | ICD-10-CM | POA: Diagnosis not present

## 2024-05-11 DIAGNOSIS — F331 Major depressive disorder, recurrent, moderate: Secondary | ICD-10-CM

## 2024-05-11 DIAGNOSIS — F4310 Post-traumatic stress disorder, unspecified: Secondary | ICD-10-CM

## 2024-05-11 MED ORDER — ESCITALOPRAM OXALATE 5 MG PO TABS: 15.0000 mg | ORAL_TABLET | Freq: Every day | ORAL | 0 refills | Status: AC

## 2024-05-11 NOTE — Telephone Encounter (Signed)
 Pt called and said that she has lost her bottle of the five mg lexapro  and needs it asap

## 2024-05-11 NOTE — Telephone Encounter (Signed)
Responded elsewhere

## 2024-05-12 DIAGNOSIS — F3181 Bipolar II disorder: Secondary | ICD-10-CM | POA: Diagnosis not present

## 2024-05-13 DIAGNOSIS — F3181 Bipolar II disorder: Secondary | ICD-10-CM | POA: Diagnosis not present

## 2024-05-16 ENCOUNTER — Other Ambulatory Visit: Payer: Self-pay | Admitting: Psychiatry

## 2024-05-16 DIAGNOSIS — F331 Major depressive disorder, recurrent, moderate: Secondary | ICD-10-CM

## 2024-05-16 DIAGNOSIS — F4310 Post-traumatic stress disorder, unspecified: Secondary | ICD-10-CM

## 2024-05-20 DIAGNOSIS — F3341 Major depressive disorder, recurrent, in partial remission: Secondary | ICD-10-CM | POA: Diagnosis not present

## 2024-05-26 ENCOUNTER — Encounter: Payer: Self-pay | Admitting: Psychiatry

## 2024-05-26 ENCOUNTER — Ambulatory Visit (INDEPENDENT_AMBULATORY_CARE_PROVIDER_SITE_OTHER): Admitting: Psychiatry

## 2024-05-26 DIAGNOSIS — F4310 Post-traumatic stress disorder, unspecified: Secondary | ICD-10-CM | POA: Diagnosis not present

## 2024-05-26 DIAGNOSIS — F5105 Insomnia due to other mental disorder: Secondary | ICD-10-CM

## 2024-05-26 DIAGNOSIS — F900 Attention-deficit hyperactivity disorder, predominantly inattentive type: Secondary | ICD-10-CM

## 2024-05-26 DIAGNOSIS — F331 Major depressive disorder, recurrent, moderate: Secondary | ICD-10-CM

## 2024-05-26 DIAGNOSIS — M797 Fibromyalgia: Secondary | ICD-10-CM
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
## 2024-05-27 DIAGNOSIS — F3341 Major depressive disorder, recurrent, in partial remission: Secondary | ICD-10-CM | POA: Diagnosis not present

## 2024-05-28 DIAGNOSIS — F3181 Bipolar II disorder: Secondary | ICD-10-CM | POA: Diagnosis not present

## 2024-05-31 DIAGNOSIS — F3181 Bipolar II disorder: Secondary | ICD-10-CM | POA: Diagnosis not present

## 2024-06-04 DIAGNOSIS — L820 Inflamed seborrheic keratosis: Secondary | ICD-10-CM | POA: Diagnosis not present

## 2024-06-04 DIAGNOSIS — B353 Tinea pedis: Secondary | ICD-10-CM | POA: Diagnosis not present

## 2024-06-04 DIAGNOSIS — B351 Tinea unguium: Secondary | ICD-10-CM | POA: Diagnosis not present

## 2024-06-04 DIAGNOSIS — D229 Melanocytic nevi, unspecified: Secondary | ICD-10-CM | POA: Diagnosis not present

## 2024-06-04 DIAGNOSIS — D485 Neoplasm of uncertain behavior of skin: Secondary | ICD-10-CM | POA: Diagnosis not present

## 2024-06-04 DIAGNOSIS — D1801 Hemangioma of skin and subcutaneous tissue: Secondary | ICD-10-CM | POA: Diagnosis not present

## 2024-06-09 DIAGNOSIS — F3181 Bipolar II disorder: Secondary | ICD-10-CM | POA: Diagnosis not present

## 2024-06-11 DIAGNOSIS — F3181 Bipolar II disorder: Secondary | ICD-10-CM | POA: Diagnosis not present

## 2024-06-11 DIAGNOSIS — Z79899 Other long term (current) drug therapy: Secondary | ICD-10-CM | POA: Diagnosis not present

## 2024-06-17 DIAGNOSIS — F3341 Major depressive disorder, recurrent, in partial remission: Secondary | ICD-10-CM | POA: Diagnosis not present

## 2024-06-24 DIAGNOSIS — Z1329 Encounter for screening for other suspected endocrine disorder: Secondary | ICD-10-CM | POA: Diagnosis not present

## 2024-06-24 DIAGNOSIS — E538 Deficiency of other specified B group vitamins: Secondary | ICD-10-CM | POA: Diagnosis not present

## 2024-06-24 DIAGNOSIS — Z131 Encounter for screening for diabetes mellitus: Secondary | ICD-10-CM | POA: Diagnosis not present

## 2024-06-24 DIAGNOSIS — R5382 Chronic fatigue, unspecified: Secondary | ICD-10-CM | POA: Diagnosis not present

## 2024-06-24 DIAGNOSIS — Z1321 Encounter for screening for nutritional disorder: Secondary | ICD-10-CM | POA: Diagnosis not present

## 2024-06-24 DIAGNOSIS — M797 Fibromyalgia: Secondary | ICD-10-CM | POA: Diagnosis not present

## 2024-06-24 DIAGNOSIS — E063 Autoimmune thyroiditis: Secondary | ICD-10-CM | POA: Diagnosis not present

## 2024-06-24 DIAGNOSIS — E611 Iron deficiency: Secondary | ICD-10-CM | POA: Diagnosis not present

## 2024-06-24 DIAGNOSIS — E039 Hypothyroidism, unspecified: Secondary | ICD-10-CM | POA: Diagnosis not present

## 2024-06-30 DIAGNOSIS — F3341 Major depressive disorder, recurrent, in partial remission: Secondary | ICD-10-CM | POA: Diagnosis not present

## 2024-07-02 DIAGNOSIS — F3181 Bipolar II disorder: Secondary | ICD-10-CM | POA: Diagnosis not present

## 2024-07-02 DIAGNOSIS — F419 Anxiety disorder, unspecified: Secondary | ICD-10-CM | POA: Diagnosis not present

## 2024-07-02 DIAGNOSIS — H6990 Unspecified Eustachian tube disorder, unspecified ear: Secondary | ICD-10-CM | POA: Diagnosis not present

## 2024-07-02 DIAGNOSIS — R059 Cough, unspecified: Secondary | ICD-10-CM | POA: Diagnosis not present

## 2024-07-06 ENCOUNTER — Telehealth: Admitting: Psychiatry

## 2024-07-21 DIAGNOSIS — F3181 Bipolar II disorder: Secondary | ICD-10-CM | POA: Diagnosis not present

## 2024-07-22 DIAGNOSIS — M797 Fibromyalgia: Secondary | ICD-10-CM | POA: Diagnosis not present

## 2024-07-22 DIAGNOSIS — R5382 Chronic fatigue, unspecified: Secondary | ICD-10-CM | POA: Diagnosis not present

## 2024-07-22 DIAGNOSIS — E039 Hypothyroidism, unspecified: Secondary | ICD-10-CM | POA: Diagnosis not present

## 2024-07-22 DIAGNOSIS — E559 Vitamin D deficiency, unspecified: Secondary | ICD-10-CM | POA: Diagnosis not present

## 2024-07-29 DIAGNOSIS — F411 Generalized anxiety disorder: Secondary | ICD-10-CM | POA: Diagnosis not present

## 2024-07-29 DIAGNOSIS — F3130 Bipolar disorder, current episode depressed, mild or moderate severity, unspecified: Secondary | ICD-10-CM | POA: Diagnosis not present

## 2024-08-05 ENCOUNTER — Telehealth: Admitting: Psychiatry

## 2024-08-05 DIAGNOSIS — E663 Overweight: Secondary | ICD-10-CM | POA: Diagnosis not present

## 2024-08-05 DIAGNOSIS — K219 Gastro-esophageal reflux disease without esophagitis: Secondary | ICD-10-CM | POA: Diagnosis not present

## 2024-08-05 DIAGNOSIS — F5081 Binge eating disorder, mild: Secondary | ICD-10-CM | POA: Diagnosis not present

## 2024-08-05 DIAGNOSIS — Z8639 Personal history of other endocrine, nutritional and metabolic disease: Secondary | ICD-10-CM | POA: Diagnosis not present

## 2024-08-13 DIAGNOSIS — L089 Local infection of the skin and subcutaneous tissue, unspecified: Secondary | ICD-10-CM | POA: Diagnosis not present

## 2024-08-13 DIAGNOSIS — B353 Tinea pedis: Secondary | ICD-10-CM | POA: Diagnosis not present

## 2024-08-13 DIAGNOSIS — E039 Hypothyroidism, unspecified: Secondary | ICD-10-CM | POA: Diagnosis not present

## 2024-08-13 DIAGNOSIS — B3731 Acute candidiasis of vulva and vagina: Secondary | ICD-10-CM | POA: Diagnosis not present

## 2024-08-13 DIAGNOSIS — F3181 Bipolar II disorder: Secondary | ICD-10-CM | POA: Diagnosis not present

## 2024-08-13 DIAGNOSIS — B351 Tinea unguium: Secondary | ICD-10-CM | POA: Diagnosis not present

## 2024-08-13 DIAGNOSIS — R110 Nausea: Secondary | ICD-10-CM | POA: Diagnosis not present

## 2024-08-18 DIAGNOSIS — F3341 Major depressive disorder, recurrent, in partial remission: Secondary | ICD-10-CM | POA: Diagnosis not present

## 2024-08-25 DIAGNOSIS — F411 Generalized anxiety disorder: Secondary | ICD-10-CM | POA: Diagnosis not present

## 2024-08-25 DIAGNOSIS — F3130 Bipolar disorder, current episode depressed, mild or moderate severity, unspecified: Secondary | ICD-10-CM | POA: Diagnosis not present

## 2024-08-26 DIAGNOSIS — E039 Hypothyroidism, unspecified: Secondary | ICD-10-CM | POA: Diagnosis not present

## 2024-08-26 DIAGNOSIS — G600 Hereditary motor and sensory neuropathy: Secondary | ICD-10-CM | POA: Diagnosis not present

## 2024-08-26 DIAGNOSIS — F32A Depression, unspecified: Secondary | ICD-10-CM | POA: Diagnosis not present

## 2024-08-26 DIAGNOSIS — Z8269 Family history of other diseases of the musculoskeletal system and connective tissue: Secondary | ICD-10-CM | POA: Diagnosis not present

## 2024-08-26 DIAGNOSIS — Z79890 Hormone replacement therapy (postmenopausal): Secondary | ICD-10-CM | POA: Diagnosis not present

## 2024-08-26 DIAGNOSIS — F419 Anxiety disorder, unspecified: Secondary | ICD-10-CM | POA: Diagnosis not present

## 2024-08-26 DIAGNOSIS — K219 Gastro-esophageal reflux disease without esophagitis: Secondary | ICD-10-CM | POA: Diagnosis not present

## 2024-08-26 DIAGNOSIS — K4090 Unilateral inguinal hernia, without obstruction or gangrene, not specified as recurrent: Secondary | ICD-10-CM | POA: Diagnosis not present

## 2024-08-26 DIAGNOSIS — K439 Ventral hernia without obstruction or gangrene: Secondary | ICD-10-CM | POA: Diagnosis not present

## 2024-08-26 DIAGNOSIS — Z888 Allergy status to other drugs, medicaments and biological substances status: Secondary | ICD-10-CM | POA: Diagnosis not present

## 2024-08-26 DIAGNOSIS — Z7985 Long-term (current) use of injectable non-insulin antidiabetic drugs: Secondary | ICD-10-CM | POA: Diagnosis not present

## 2024-08-27 DIAGNOSIS — Z888 Allergy status to other drugs, medicaments and biological substances status: Secondary | ICD-10-CM | POA: Diagnosis not present

## 2024-08-27 DIAGNOSIS — G600 Hereditary motor and sensory neuropathy: Secondary | ICD-10-CM | POA: Diagnosis not present

## 2024-08-27 DIAGNOSIS — Z7985 Long-term (current) use of injectable non-insulin antidiabetic drugs: Secondary | ICD-10-CM | POA: Diagnosis not present

## 2024-08-27 DIAGNOSIS — F32A Depression, unspecified: Secondary | ICD-10-CM | POA: Diagnosis not present

## 2024-08-27 DIAGNOSIS — K4090 Unilateral inguinal hernia, without obstruction or gangrene, not specified as recurrent: Secondary | ICD-10-CM | POA: Diagnosis not present

## 2024-08-27 DIAGNOSIS — Z8269 Family history of other diseases of the musculoskeletal system and connective tissue: Secondary | ICD-10-CM | POA: Diagnosis not present

## 2024-08-27 DIAGNOSIS — E039 Hypothyroidism, unspecified: Secondary | ICD-10-CM | POA: Diagnosis not present

## 2024-08-27 DIAGNOSIS — K219 Gastro-esophageal reflux disease without esophagitis: Secondary | ICD-10-CM | POA: Diagnosis not present

## 2024-08-27 DIAGNOSIS — F419 Anxiety disorder, unspecified: Secondary | ICD-10-CM | POA: Diagnosis not present

## 2024-08-27 DIAGNOSIS — K439 Ventral hernia without obstruction or gangrene: Secondary | ICD-10-CM | POA: Diagnosis not present

## 2024-08-27 DIAGNOSIS — Z79890 Hormone replacement therapy (postmenopausal): Secondary | ICD-10-CM | POA: Diagnosis not present

## 2024-09-01 DIAGNOSIS — R1031 Right lower quadrant pain: Secondary | ICD-10-CM | POA: Diagnosis not present

## 2024-09-16 DIAGNOSIS — F3341 Major depressive disorder, recurrent, in partial remission: Secondary | ICD-10-CM | POA: Diagnosis not present

## 2024-09-17 DIAGNOSIS — Z79899 Other long term (current) drug therapy: Secondary | ICD-10-CM | POA: Diagnosis not present

## 2024-09-17 DIAGNOSIS — F411 Generalized anxiety disorder: Secondary | ICD-10-CM | POA: Diagnosis not present

## 2024-09-17 DIAGNOSIS — F3130 Bipolar disorder, current episode depressed, mild or moderate severity, unspecified: Secondary | ICD-10-CM | POA: Diagnosis not present

## 2024-09-20 DIAGNOSIS — Z79899 Other long term (current) drug therapy: Secondary | ICD-10-CM | POA: Diagnosis not present

## 2024-09-20 DIAGNOSIS — R441 Visual hallucinations: Secondary | ICD-10-CM | POA: Diagnosis not present

## 2024-09-20 DIAGNOSIS — T50905D Adverse effect of unspecified drugs, medicaments and biological substances, subsequent encounter: Secondary | ICD-10-CM | POA: Diagnosis not present

## 2024-09-22 DIAGNOSIS — F3130 Bipolar disorder, current episode depressed, mild or moderate severity, unspecified: Secondary | ICD-10-CM | POA: Diagnosis not present

## 2024-09-22 DIAGNOSIS — F411 Generalized anxiety disorder: Secondary | ICD-10-CM | POA: Diagnosis not present

## 2024-09-23 DIAGNOSIS — F3341 Major depressive disorder, recurrent, in partial remission: Secondary | ICD-10-CM | POA: Diagnosis not present
